# Patient Record
Sex: Female | Born: 1980 | Race: White | Hispanic: No | Marital: Married | Smoking: Current every day smoker
Health system: Southern US, Community
[De-identification: ages and names within clinical notes are randomized; demographics above are authoritative.]

## PROBLEM LIST (undated history)

## (undated) DIAGNOSIS — O99891 Other specified diseases and conditions complicating pregnancy: Secondary | ICD-10-CM

## (undated) DIAGNOSIS — K219 Gastro-esophageal reflux disease without esophagitis: Secondary | ICD-10-CM

## (undated) DIAGNOSIS — M549 Dorsalgia, unspecified: Secondary | ICD-10-CM

## (undated) DIAGNOSIS — R87629 Unspecified abnormal cytological findings in specimens from vagina: Secondary | ICD-10-CM

## (undated) DIAGNOSIS — M797 Fibromyalgia: Secondary | ICD-10-CM

## (undated) DIAGNOSIS — O9989 Other specified diseases and conditions complicating pregnancy, childbirth and the puerperium: Secondary | ICD-10-CM

## (undated) HISTORY — PX: LEEP: SHX91

---

## 2009-12-10 ENCOUNTER — Inpatient Hospital Stay (HOSPITAL_COMMUNITY): Admission: RE | Admit: 2009-12-10 | Discharge: 2009-12-12 | Payer: Self-pay | Admitting: Obstetrics

## 2009-12-11 ENCOUNTER — Encounter: Payer: Self-pay | Admitting: Obstetrics

## 2009-12-18 ENCOUNTER — Ambulatory Visit (HOSPITAL_COMMUNITY): Admission: RE | Admit: 2009-12-18 | Discharge: 2009-12-18 | Payer: Self-pay | Admitting: Obstetrics

## 2010-01-14 ENCOUNTER — Ambulatory Visit (HOSPITAL_COMMUNITY): Admission: RE | Admit: 2010-01-14 | Discharge: 2010-01-14 | Payer: Self-pay | Admitting: Obstetrics

## 2010-02-14 ENCOUNTER — Telehealth: Payer: Self-pay | Admitting: Internal Medicine

## 2010-02-19 ENCOUNTER — Ambulatory Visit: Payer: Self-pay | Admitting: Internal Medicine

## 2010-02-19 ENCOUNTER — Ambulatory Visit: Payer: Self-pay

## 2010-02-19 ENCOUNTER — Ambulatory Visit (HOSPITAL_COMMUNITY): Admission: RE | Admit: 2010-02-19 | Discharge: 2010-02-19 | Payer: Self-pay | Admitting: Internal Medicine

## 2010-02-19 ENCOUNTER — Encounter: Payer: Self-pay | Admitting: Internal Medicine

## 2010-03-27 ENCOUNTER — Ambulatory Visit (HOSPITAL_COMMUNITY): Admission: RE | Admit: 2010-03-27 | Discharge: 2010-03-27 | Payer: Self-pay | Admitting: Obstetrics

## 2010-04-05 ENCOUNTER — Inpatient Hospital Stay (HOSPITAL_COMMUNITY): Admission: AD | Admit: 2010-04-05 | Discharge: 2010-04-06 | Payer: Self-pay | Admitting: Obstetrics & Gynecology

## 2010-05-01 ENCOUNTER — Inpatient Hospital Stay (HOSPITAL_COMMUNITY): Admission: AD | Admit: 2010-05-01 | Discharge: 2010-05-03 | Payer: Self-pay | Admitting: Obstetrics

## 2010-05-05 ENCOUNTER — Encounter: Payer: Self-pay | Admitting: Obstetrics & Gynecology

## 2010-05-05 ENCOUNTER — Inpatient Hospital Stay (HOSPITAL_COMMUNITY): Admission: RE | Admit: 2010-05-05 | Discharge: 2010-05-08 | Payer: Self-pay | Admitting: Obstetrics & Gynecology

## 2010-11-06 ENCOUNTER — Inpatient Hospital Stay (HOSPITAL_COMMUNITY): Admission: AD | Admit: 2010-11-06 | Discharge: 2010-03-17 | Payer: Self-pay | Admitting: Obstetrics

## 2010-12-21 ENCOUNTER — Encounter: Payer: Self-pay | Admitting: Obstetrics

## 2010-12-30 NOTE — Progress Notes (Signed)
Summary: echo  Phone Note Call from Patient Call back at Home Phone [phone]   Caller: Patient Reason for Call: Talk to Nurse Summary of Call: pt called to get echo resch.... 1st available appt is 4/8, wants to know if she can be referred to another cardiologist ofc that could get it done sooner Initial call taken by: Migdalia Dk,  February 14, 2010 11:03 AM  Follow-up for Phone Call        02/14/10--12 noon--pt calling stating she would like to get a sooner appoint for ECHO--she is pregnant and has hx of hemmorrhage in in one of her eyes and her present eye doctor wants her to have it sooner than 4/8--spoke to sched and found a spot for ECHO to be done 02/19/10 at 4pm--pt notified and agrees--nt Follow-up by: Ledon Snare, RN,  February 14, 2010 12:03 PM

## 2011-02-15 LAB — URINALYSIS, ROUTINE W REFLEX MICROSCOPIC
Bilirubin Urine: NEGATIVE
Glucose, UA: NEGATIVE mg/dL
Ketones, ur: NEGATIVE mg/dL
Nitrite: NEGATIVE
Specific Gravity, Urine: 1.015 (ref 1.005–1.030)
pH: 8 (ref 5.0–8.0)

## 2011-02-15 LAB — URINE CULTURE: Colony Count: NO GROWTH

## 2011-02-15 LAB — WET PREP, GENITAL: Yeast Wet Prep HPF POC: NONE SEEN

## 2011-02-15 LAB — GC/CHLAMYDIA PROBE AMP, GENITAL
Chlamydia, DNA Probe: NEGATIVE
GC Probe Amp, Genital: NEGATIVE

## 2011-02-16 LAB — RPR
RPR Ser Ql: NONREACTIVE
RPR Ser Ql: NONREACTIVE

## 2011-02-16 LAB — CBC
HCT: 25 % — ABNORMAL LOW (ref 36.0–46.0)
HCT: 34.2 % — ABNORMAL LOW (ref 36.0–46.0)
MCHC: 34.6 g/dL (ref 30.0–36.0)
MCHC: 35.4 g/dL (ref 30.0–36.0)
MCV: 94.4 fL (ref 78.0–100.0)
MCV: 95.1 fL (ref 78.0–100.0)
Platelets: 198 10*3/uL (ref 150–400)
Platelets: 272 10*3/uL (ref 150–400)
RBC: 3.64 MIL/uL — ABNORMAL LOW (ref 3.87–5.11)
RDW: 13.1 % (ref 11.5–15.5)
RDW: 13.1 % (ref 11.5–15.5)
RDW: 13.4 % (ref 11.5–15.5)
WBC: 13.4 10*3/uL — ABNORMAL HIGH (ref 4.0–10.5)

## 2011-02-16 LAB — GLUCOSE, CAPILLARY
Glucose-Capillary: 77 mg/dL (ref 70–99)
Glucose-Capillary: 85 mg/dL (ref 70–99)

## 2011-08-15 IMAGING — US US OB TRANSVAGINAL
1 series · 11 of 11 positions shown · non-contrast
Comparison: none

OBSTETRICAL ULTRASOUND:
 This ultrasound was performed in The [HOSPITAL], and the [facility] report will be stored to [REDACTED] PACS.  This report is also available in [HOSPITAL]?s accessANYware.

[Series 1: us ob transvaginal · 11 of 11 slices shown]
[im 1/11]
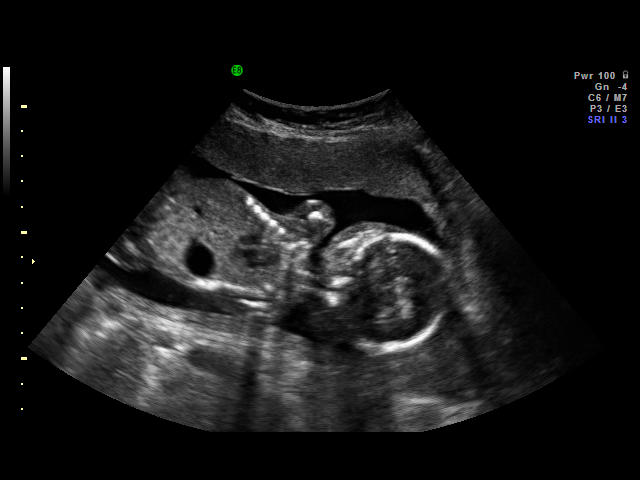
[im 2/11]
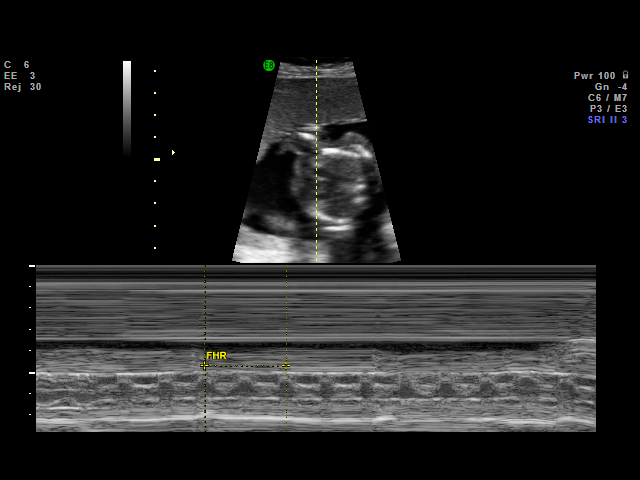
[im 3/11]
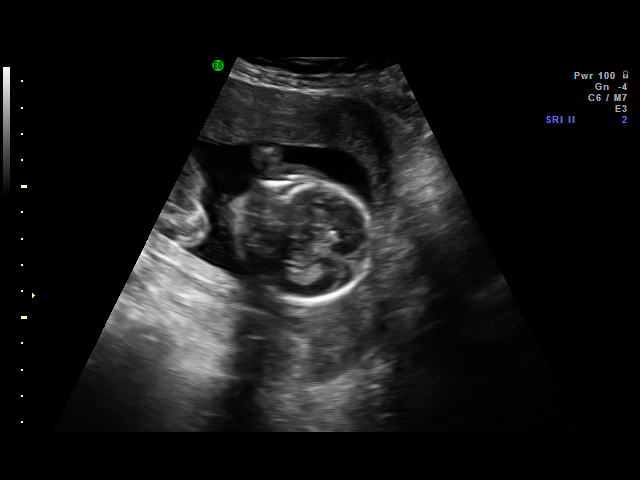
[im 4/11]
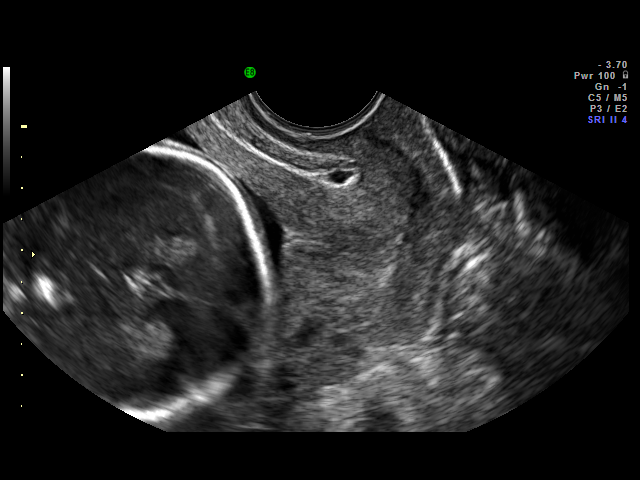
[im 5/11]
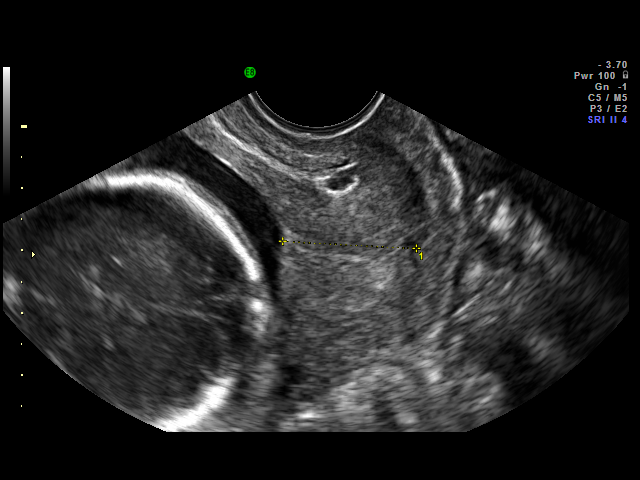
[im 6/11]
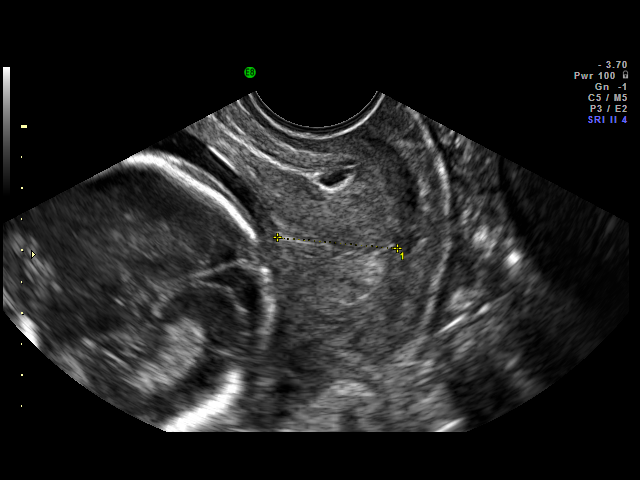
[im 7/11]
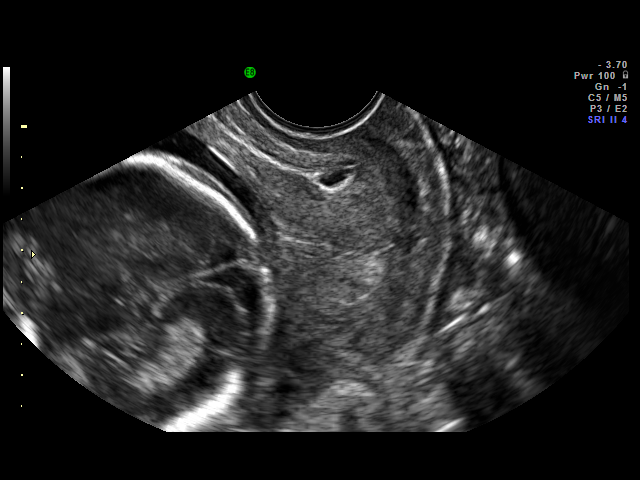
[im 8/11]
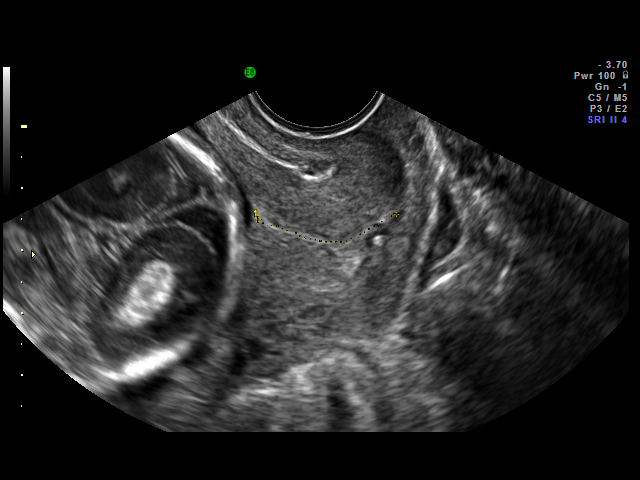
[im 9/11]
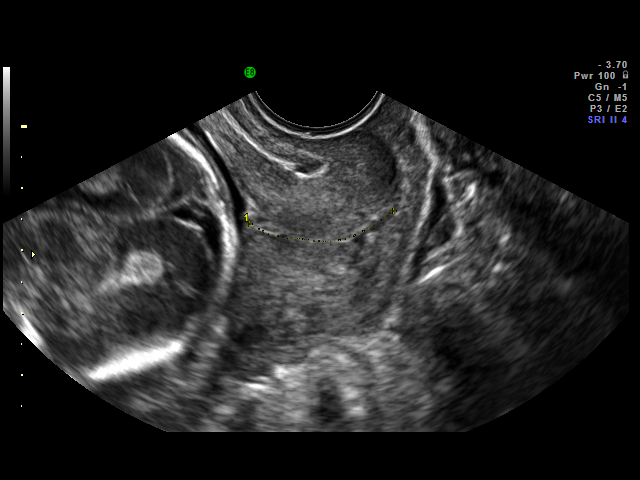
[im 10/11]
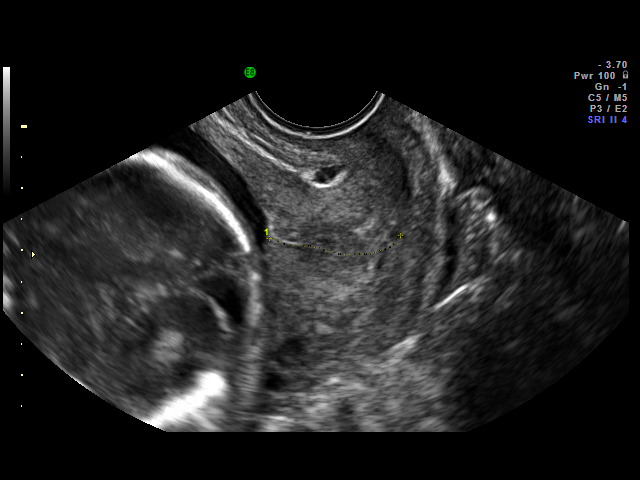
[im 11/11]
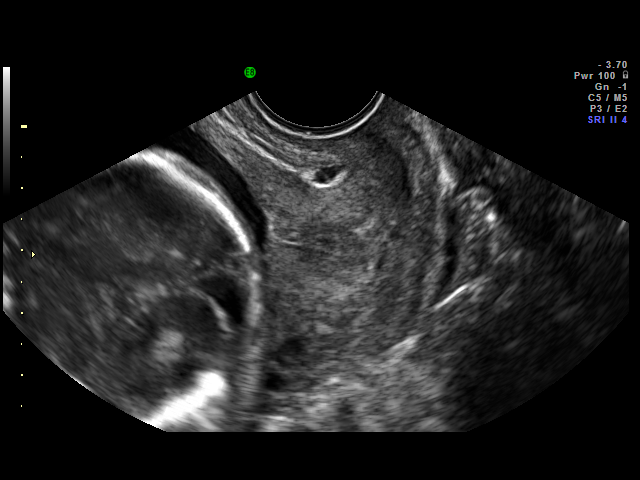

[11 of 11 positions shown; findings below may reference images not displayed]

IMPRESSION: [facility] has also been faxed to the ordering physician.

## 2011-08-22 IMAGING — US US OB TRANSVAGINAL
1 series · 13 of 13 positions shown · non-contrast
Comparison: none

OBSTETRICAL ULTRASOUND:
 This ultrasound was performed in The [HOSPITAL], and the [facility] report will be stored to [REDACTED] PACS.  This report is also available in [HOSPITAL]?s accessANYware.

[Series 1: us ob transvaginal · 13 of 13 slices shown]
[im 1/13]
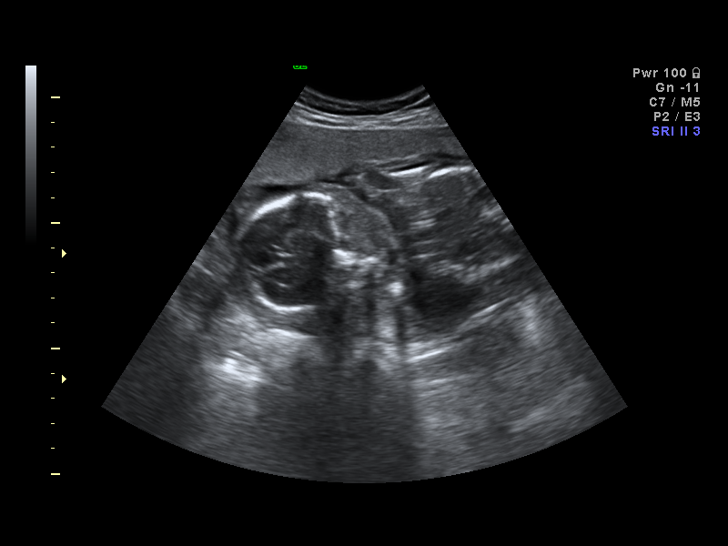
[im 2/13]
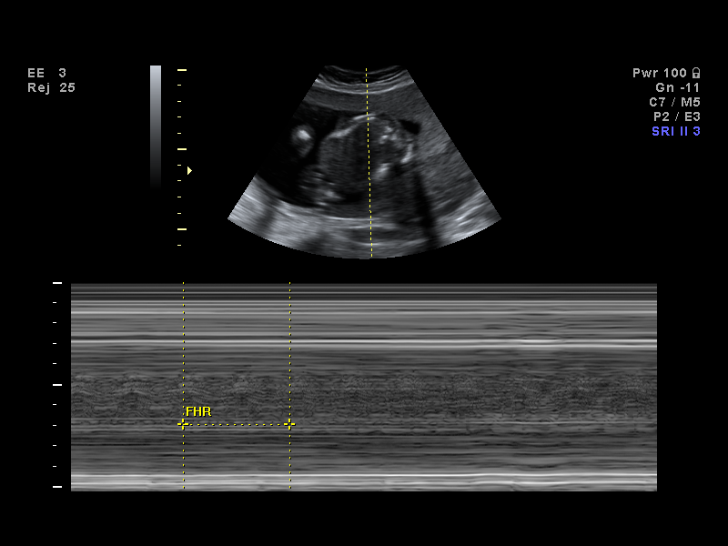
[im 3/13]
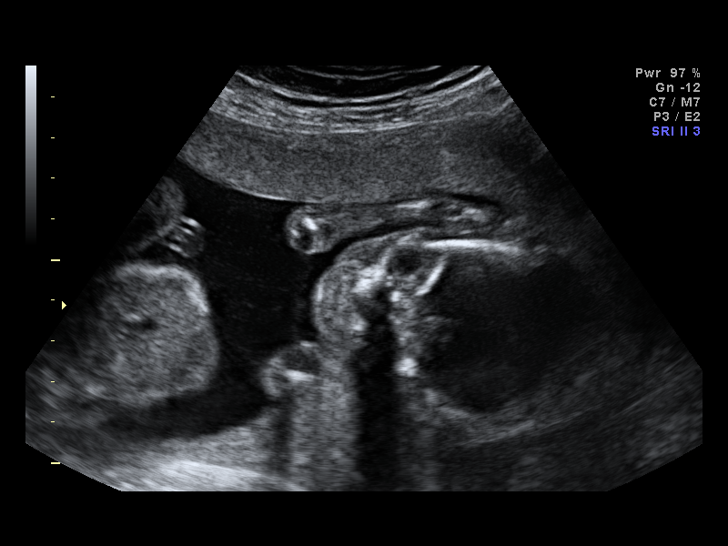
[im 4/13]
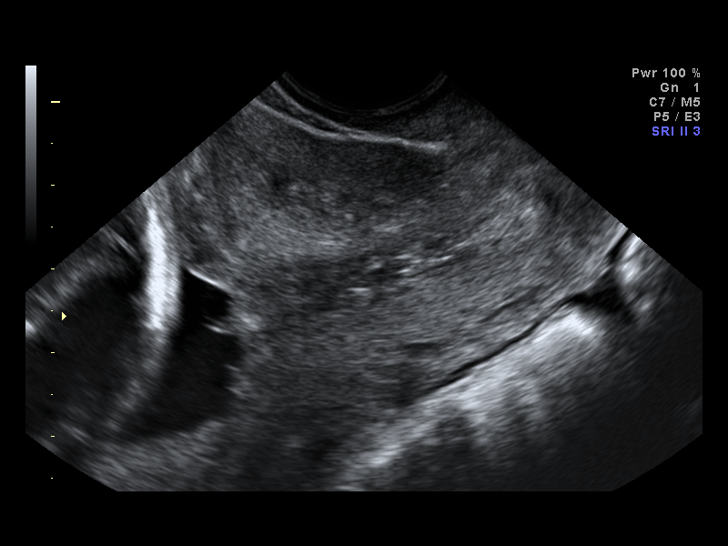
[im 5/13]
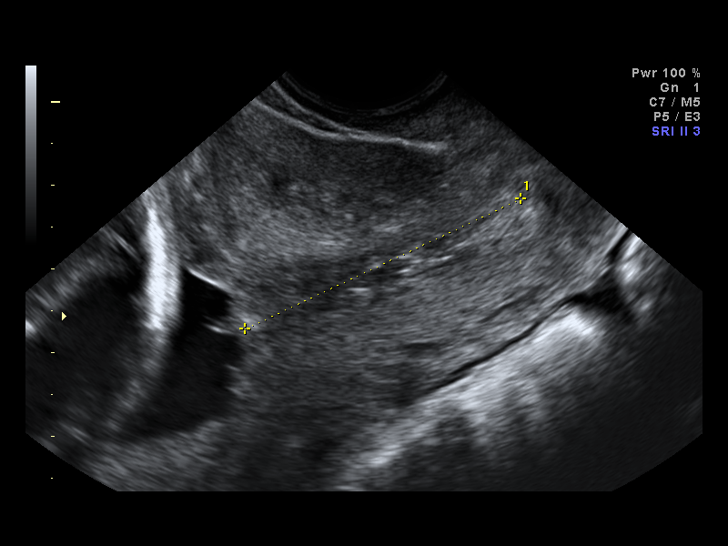
[im 6/13]
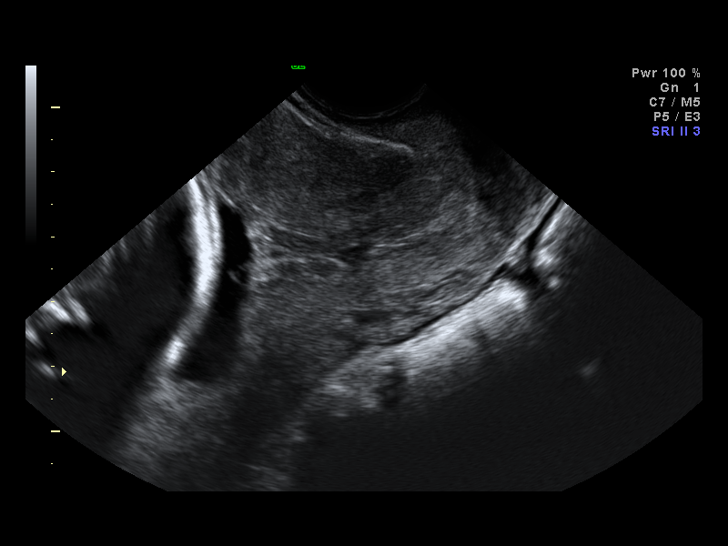
[im 7/13]
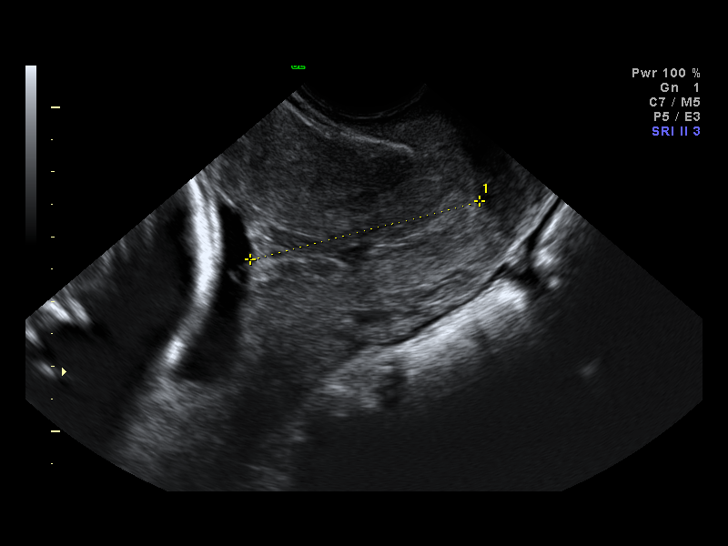
[im 8/13]
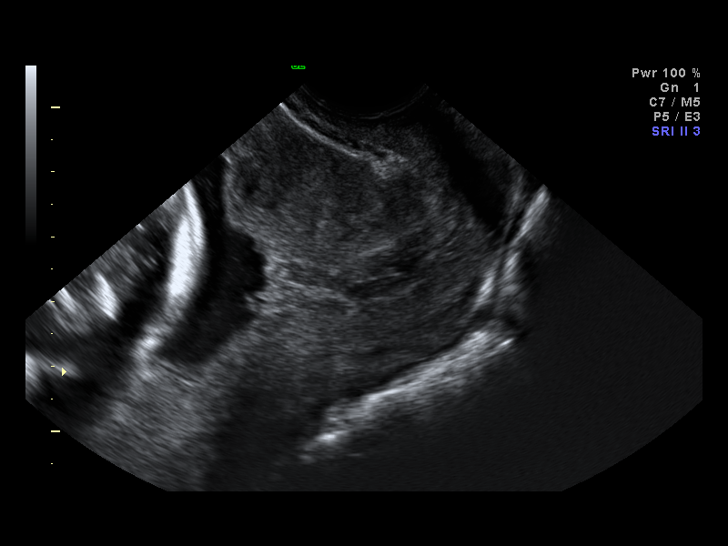
[im 9/13]
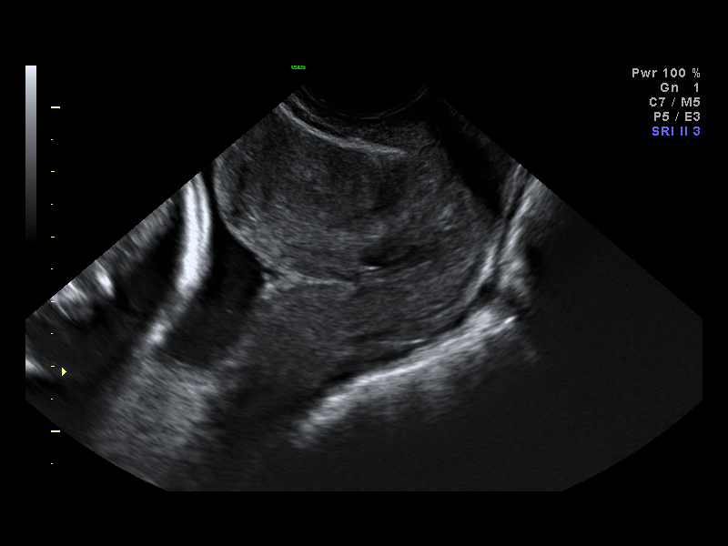
[im 10/13]
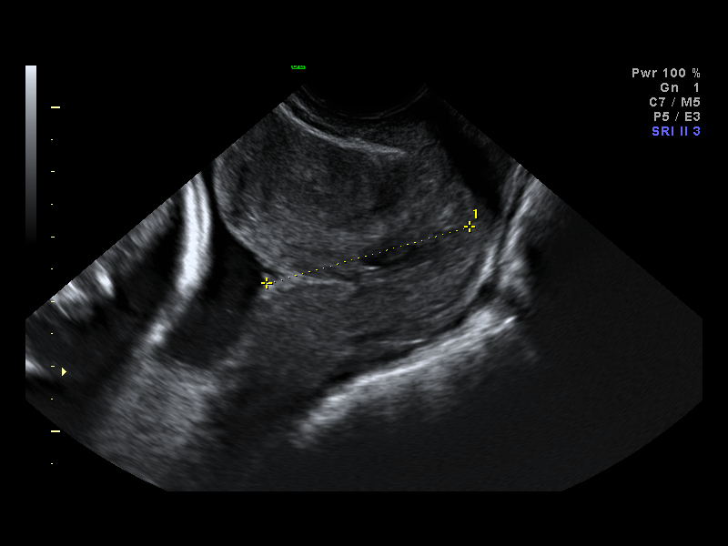
[im 11/13]
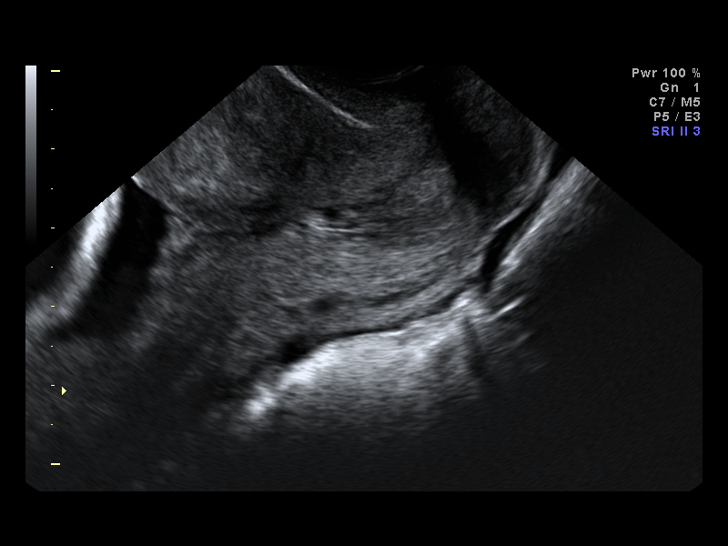
[im 12/13]
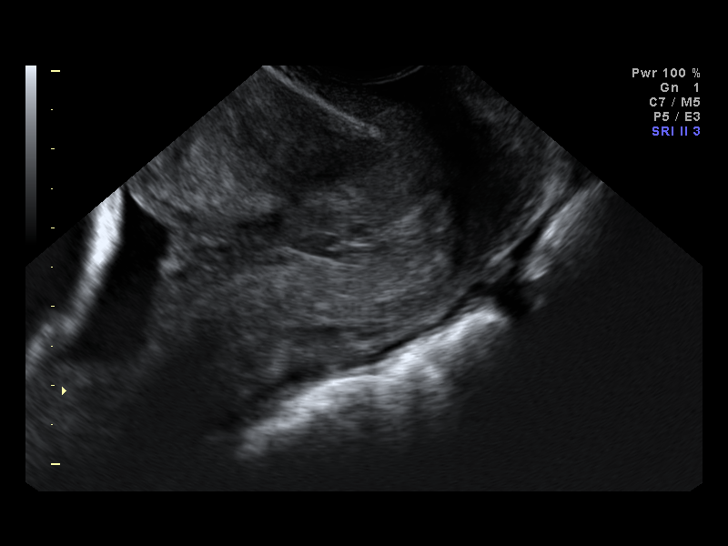
[im 13/13]
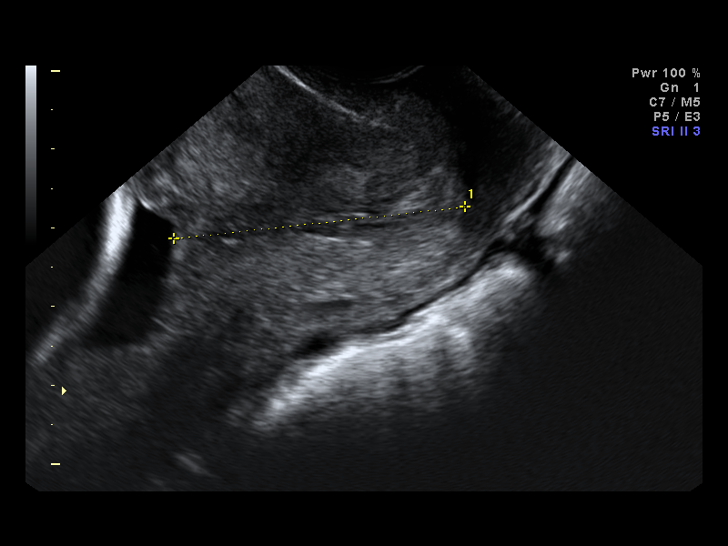

[13 of 13 positions shown; findings below may reference images not displayed]

IMPRESSION: [facility] has also been faxed to the ordering physician.

## 2011-09-01 ENCOUNTER — Inpatient Hospital Stay (INDEPENDENT_AMBULATORY_CARE_PROVIDER_SITE_OTHER)
Admission: RE | Admit: 2011-09-01 | Discharge: 2011-09-01 | Disposition: A | Discharge status: Home / Self Care | Payer: BC Managed Care – PPO | Source: Ambulatory Visit | Attending: Emergency Medicine | Admitting: Emergency Medicine

## 2011-09-01 ENCOUNTER — Encounter: Payer: Self-pay | Admitting: Emergency Medicine

## 2011-09-01 DIAGNOSIS — J039 Acute tonsillitis, unspecified: Secondary | ICD-10-CM

## 2011-09-01 LAB — CONVERTED CEMR LAB: Rapid Strep: NEGATIVE

## 2011-09-18 IMAGING — US US OB TRANSVAGINAL MODIFY
1 series · 14 of 28 positions shown · non-contrast
Comparison: none

OBSTETRICAL ULTRASOUND:
 This ultrasound was performed in The [HOSPITAL], and the [facility] report will be stored to [REDACTED] PACS.  This report is also available in [HOSPITAL]?s accessANYware.

[Series 1: us ob transvaginal modify · 14 of 56 slices shown]
[im 3/56]
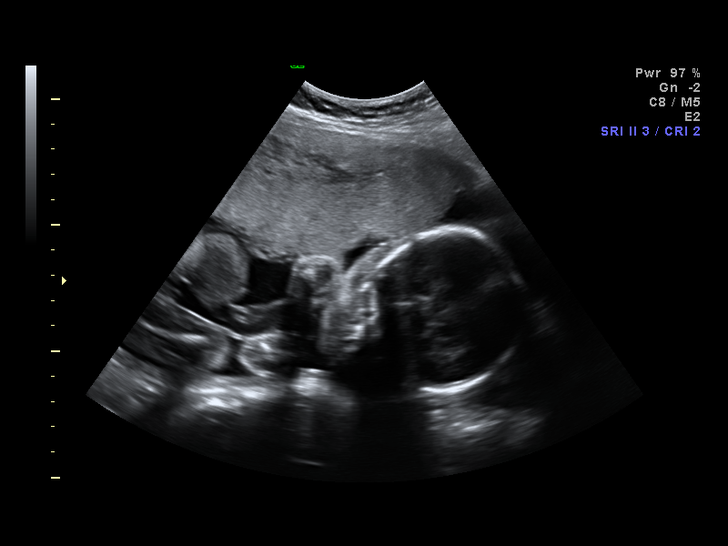
[im 7/56]
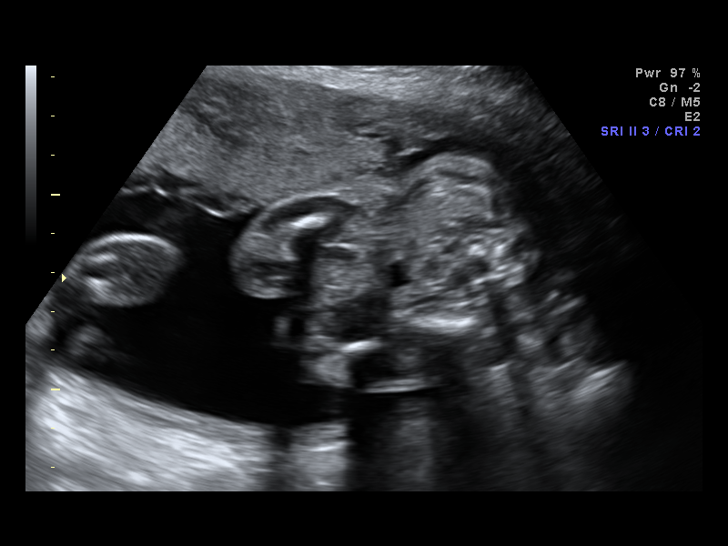
[im 11/56]
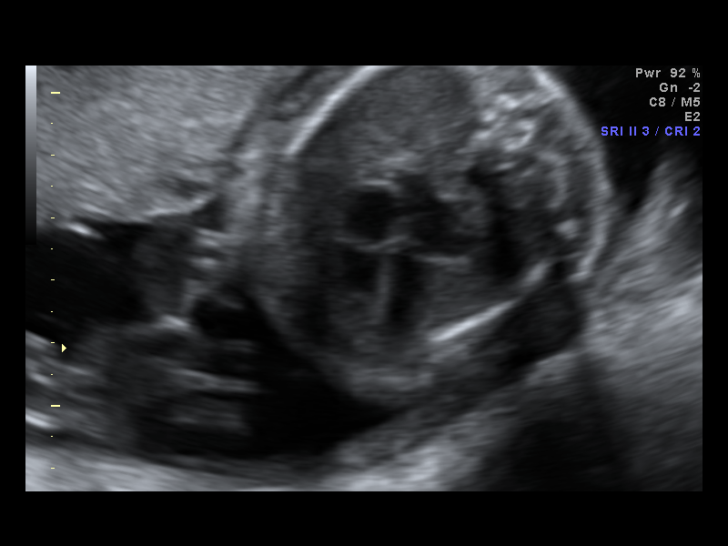
[im 15/56]
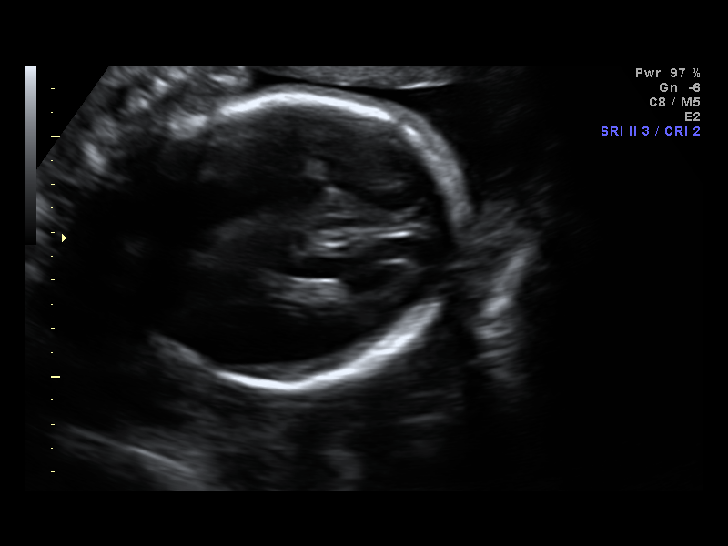
[im 19/56]
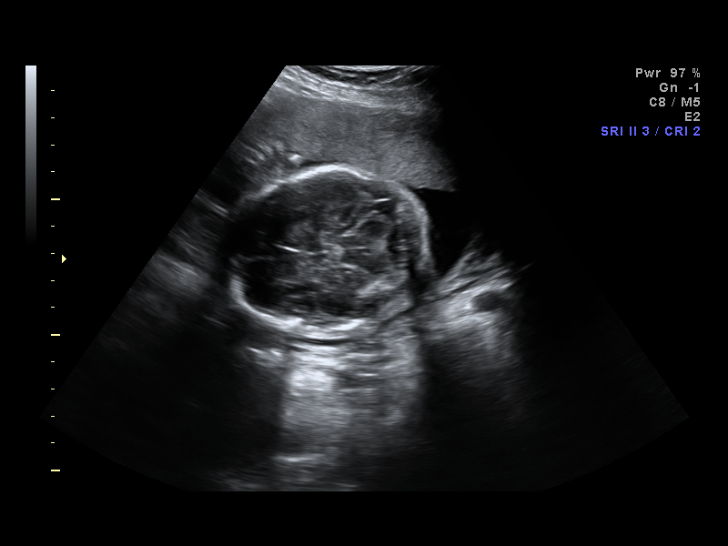
[im 23/56]
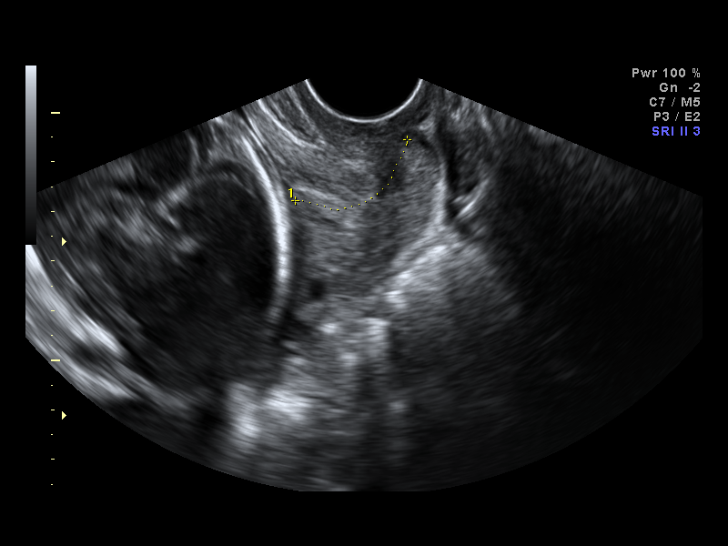
[im 27/56]
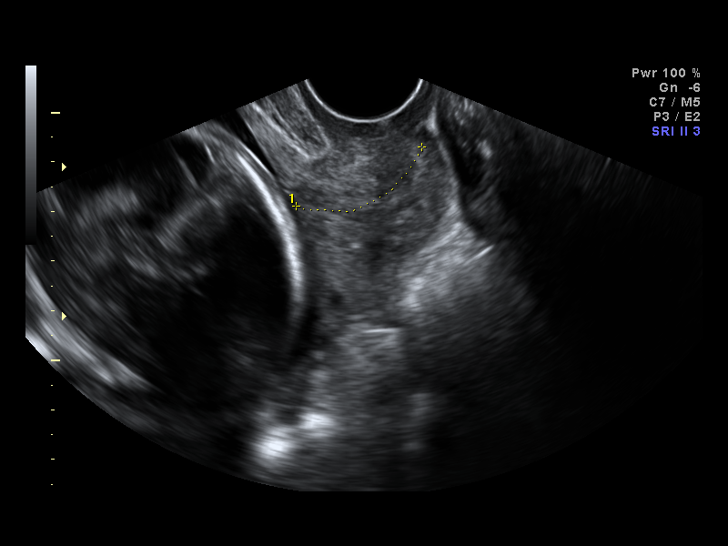
[im 31/56]
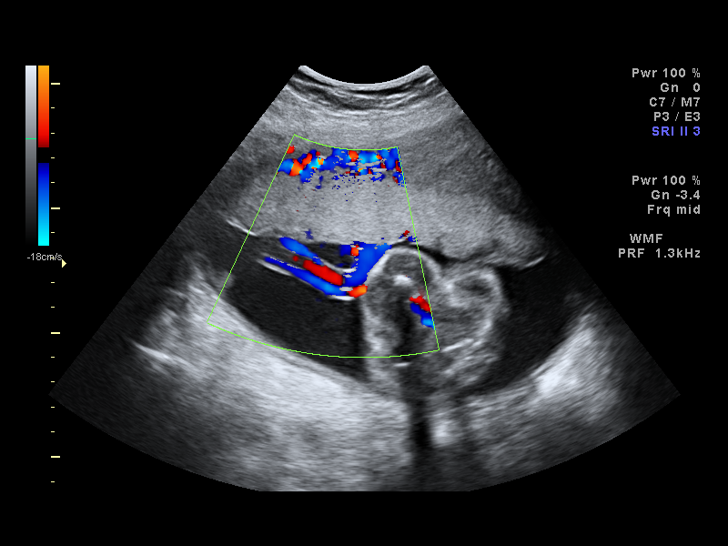
[im 35/56]
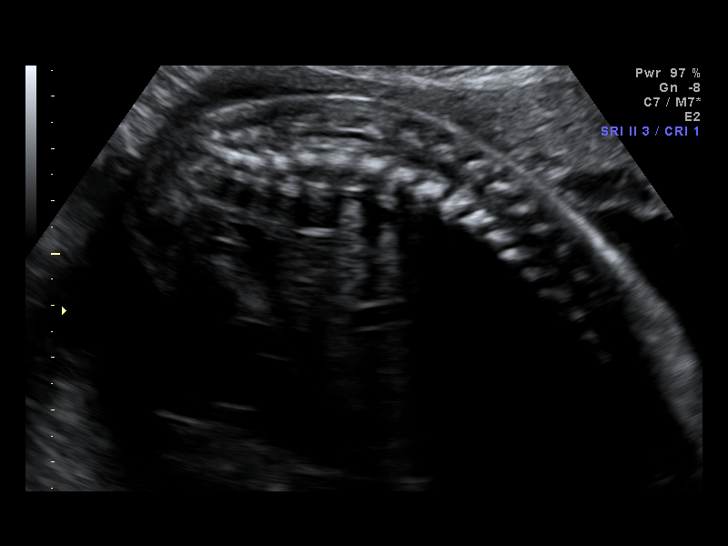
[im 39/56]
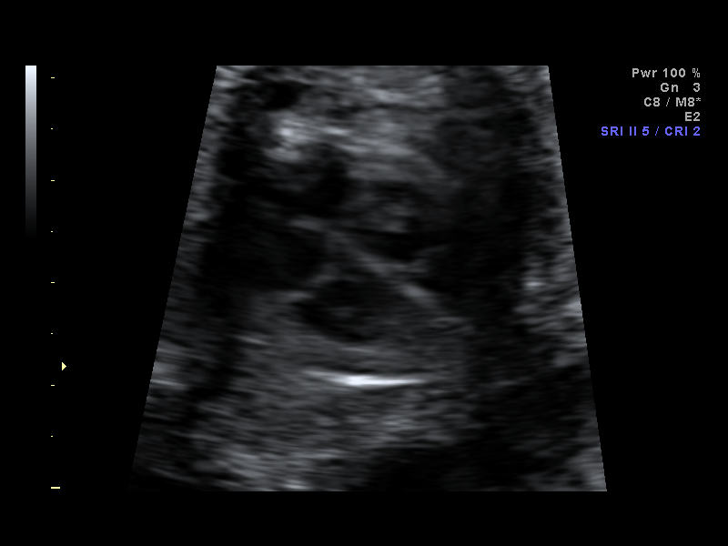
[im 43/56]
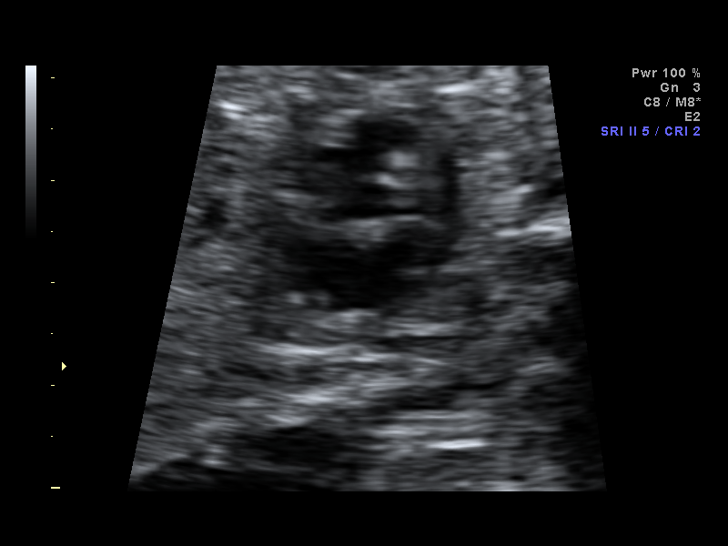
[im 47/56]
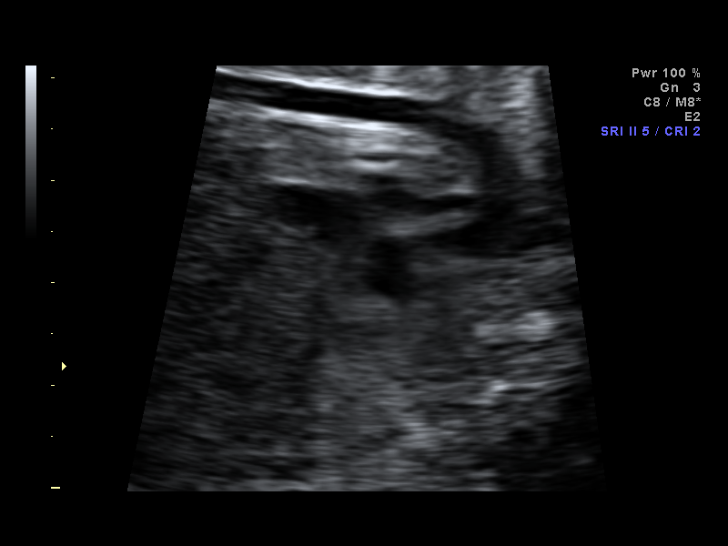
[im 51/56]
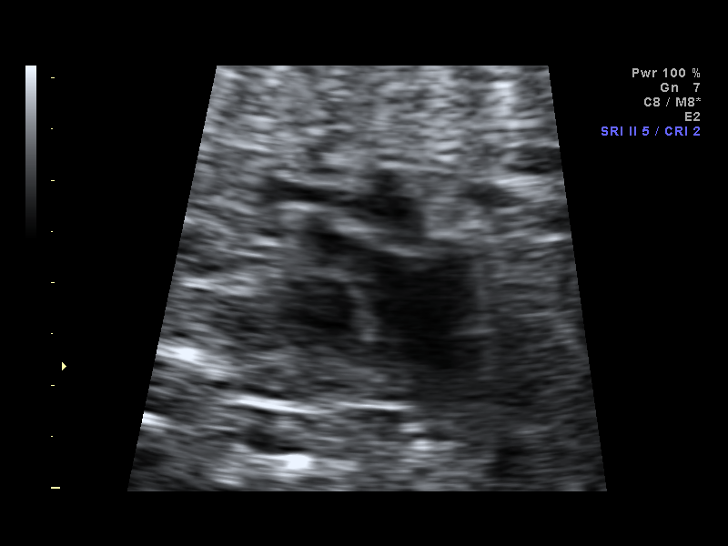
[im 56/56]
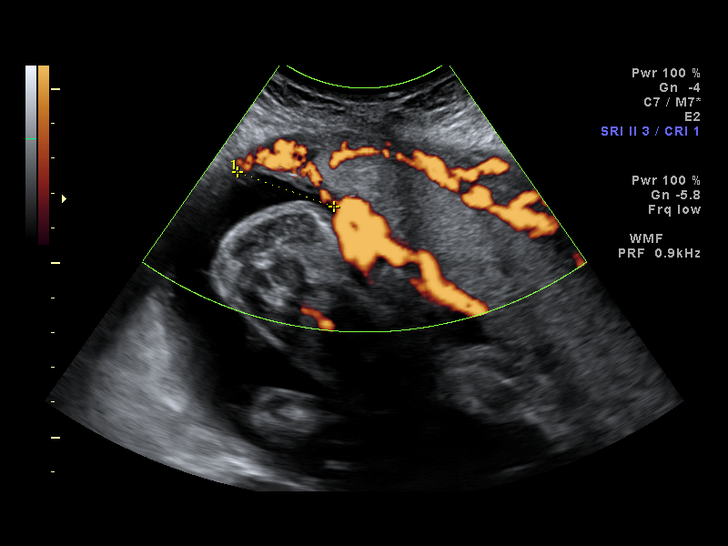

[14 of 28 positions shown; findings below may reference images not displayed]

IMPRESSION: [facility] has also been faxed to the ordering physician.

## 2011-11-02 NOTE — Progress Notes (Signed)
Summary: Possible Strep Throat rm 2   Vital Signs:  Patient Profile:   30 Years Old Female CC:      sore throat x 1 day Height:     58 inches Weight:      111.50 pounds O2 Sat:      100 % O2 treatment:    Room Air Temp:     98.8 degrees F oral Pulse rate:   66 / minute Resp:     16 per minute BP sitting:   105 / 68  (left arm) Cuff size:   regular  Vitals Entered By: Clemens Catholic LPN (September 01, 2011 12:02 PM)                  Updated Prior Medication List: No Medications Current Allergies: No known allergies History of Present Illness History from: patient Chief Complaint: sore throat x 1 day History of Present Illness: Pt complains of 1 day of severe, worsening sore throat. No cough, No dyspnea. No chest pain. No wheezing.  No nausea No vomiting. + fever, + chills and myalgias. OTC meds no help   REVIEW OF SYSTEMS Constitutional Symptoms       Complains of fever and chills.     Denies night sweats, weight loss, weight gain, and fatigue.  Eyes       Complains of eye pain.      Denies change in vision, eye discharge, glasses, contact lenses, and eye surgery. Ear/Nose/Throat/Mouth       Complains of ear pain and sore throat.      Denies hearing loss/aids, change in hearing, ear discharge, dizziness, frequent runny nose, frequent nose bleeds, sinus problems, hoarseness, and tooth pain or bleeding.  Respiratory       Denies dry cough, productive cough, wheezing, shortness of breath, asthma, bronchitis, and emphysema/COPD.  Cardiovascular       Denies murmurs, chest pain, and tires easily with exhertion.      Comments: chest hurts   Gastrointestinal       Denies stomach pain, nausea/vomiting, diarrhea, constipation, blood in bowel movements, and indigestion. Genitourniary       Denies painful urination, kidney stones, and loss of urinary control. Neurological       Complains of numbness, tingling, and weakness.      Denies headaches, loss of or changes in  sensation, tremors, paralysis, seizures, and fainting/blackouts. Musculoskeletal       Complains of muscle pain, joint pain, and joint stiffness.      Denies decreased range of motion, redness, swelling, muscle weakness, and gout.  Skin       Denies bruising, unusual mles/lumps or sores, and hair/skin or nail changes.  Psych       Denies mood changes, temper/anger issues, anxiety/stress, speech problems, depression, and sleep problems. Other Comments: pt c/o sore throat, RT ear ache, fever, achy, and chills x 1 day. she has taken Aleve.   Past History:  Past Medical History: Unremarkable  Past Surgical History: Caesarean section 2011  Family History: mother- lupus, fibromyalgia father- diabetes  Social History: Current Smoker- 5 cigs a day Alcohol use-no Drug use-no Smoking Status:  current Drug Use:  no Physical Exam General appearance: well developed, well nourished, no acute distress. Fatigued. Head: normocephalic, atraumatic, anterior cervical lymphadenopathy Eyes: conjunctivae and lids normal Pupils: equal, round, reactive to light Ears: normal, no lesions or deformities Nasal: mucosa pink, nonedematous, no septal deviation, turbinates normal Oral/Pharynx: bilateral tonsilar enlargement and redness, uvula midline without  deviation. No exudate Neck: neck supple,  trachea midline, no masses Chest/Lungs: no rales, wheezes, or rhonchi bilateral, breath sounds equal without effort Heart: regular rate and  rhythm, no murmur Abdomen: soft, non-tender without obvious organomegaly Extremities: normal extremities Neurological: grossly intact and non-focal Skin: no obvious rashes or lesions MSE: oriented to time, place, and person Assessment New Problems: ACUTE TONSILLITIS (ICD-463)  RST neg  Patient Education: Patient and/or caregiver instructed in the following: rest fluids and Tylenol.  Plan New Medications/Changes: AMOXICILLIN 500 MG CAPS (AMOXICILLIN) 1 three  times a day for 10 days  #30 x 0, 09/01/2011, Lajean Manes MD  New Orders: New Patient Level III [phone] Rapid Strep [60454] Planning Comments:   other sxs care discussed. pt declined strep cx. tx options discussed. Risks, benefits, alternatives discussed. Pt voiced understanding and agreement.  Follow Up: Follow up in 2-3 days if no improvement, Follow up with Primary Physician Follow Up: sooner if worse or new sxs.  The patient and/or caregiver has been counseled thoroughly with regard to medications prescribed including dosage, schedule, interactions, rationale for use, and possible side effects and they verbalize understanding.  Diagnoses and expected course of recovery discussed and will return if not improved as expected or if the condition worsens. Patient and/or caregiver verbalized understanding.  Prescriptions: AMOXICILLIN 500 MG CAPS (AMOXICILLIN) 1 three times a day for 10 days  #30 x 0   Entered and Authorized by:   Lajean Manes MD   Signed by:   Lajean Manes MD on 09/01/2011   Method used:   Handwritten   RxID:   [id number]   Orders Added: 1)  New Patient Level III [30865] 2)  Rapid Strep [[id number]]    Laboratory Results  Date/Time Received: September 01, 2011 12:15 PM  Date/Time Reported: September 01, 2011 12:15 PM   Other Tests  Rapid Strep: negative  Kit Test Internal QC: Negative   (Normal Range: Negative)

## 2013-12-28 LAB — OB RESULTS CONSOLE HIV ANTIBODY (ROUTINE TESTING): HIV: NONREACTIVE

## 2013-12-28 LAB — OB RESULTS CONSOLE ABO/RH: RH Type: POSITIVE

## 2013-12-28 LAB — OB RESULTS CONSOLE RPR: RPR: NONREACTIVE

## 2013-12-28 LAB — OB RESULTS CONSOLE ANTIBODY SCREEN: Antibody Screen: NEGATIVE

## 2014-03-27 ENCOUNTER — Ambulatory Visit (HOSPITAL_COMMUNITY): Payer: BC Managed Care – PPO | Attending: Obstetrics

## 2014-04-03 ENCOUNTER — Encounter (HOSPITAL_COMMUNITY): Payer: BC Managed Care – PPO

## 2014-06-27 ENCOUNTER — Other Ambulatory Visit: Payer: Self-pay | Admitting: Obstetrics

## 2014-06-28 LAB — OB RESULTS CONSOLE GBS: GBS: NEGATIVE

## 2014-07-20 ENCOUNTER — Encounter (HOSPITAL_COMMUNITY)
Admission: AD | Disposition: A | Discharge status: Home / Self Care | Payer: Self-pay | Source: Ambulatory Visit | Attending: Obstetrics and Gynecology

## 2014-07-20 ENCOUNTER — Inpatient Hospital Stay (HOSPITAL_COMMUNITY): Payer: BC Managed Care – PPO | Admitting: Anesthesiology

## 2014-07-20 ENCOUNTER — Encounter (HOSPITAL_COMMUNITY): Payer: Self-pay | Admitting: *Deleted

## 2014-07-20 ENCOUNTER — Inpatient Hospital Stay (HOSPITAL_COMMUNITY)
Admission: AD | Admit: 2014-07-20 | Discharge: 2014-07-24 | DRG: 765 | Disposition: A | Discharge status: Home / Self Care | Payer: BC Managed Care – PPO | Source: Ambulatory Visit | Attending: Obstetrics and Gynecology | Admitting: Obstetrics and Gynecology

## 2014-07-20 ENCOUNTER — Encounter (HOSPITAL_COMMUNITY): Payer: BC Managed Care – PPO | Admitting: Anesthesiology

## 2014-07-20 ENCOUNTER — Encounter (HOSPITAL_COMMUNITY): Payer: Self-pay

## 2014-07-20 DIAGNOSIS — O99334 Smoking (tobacco) complicating childbirth: Secondary | ICD-10-CM | POA: Diagnosis present

## 2014-07-20 DIAGNOSIS — D62 Acute posthemorrhagic anemia: Secondary | ICD-10-CM | POA: Diagnosis not present

## 2014-07-20 DIAGNOSIS — F1920 Other psychoactive substance dependence, uncomplicated: Secondary | ICD-10-CM | POA: Diagnosis present

## 2014-07-20 DIAGNOSIS — IMO0001 Reserved for inherently not codable concepts without codable children: Secondary | ICD-10-CM | POA: Diagnosis present

## 2014-07-20 DIAGNOSIS — O9903 Anemia complicating the puerperium: Secondary | ICD-10-CM | POA: Diagnosis present

## 2014-07-20 DIAGNOSIS — D509 Iron deficiency anemia, unspecified: Secondary | ICD-10-CM | POA: Diagnosis present

## 2014-07-20 DIAGNOSIS — Z98891 History of uterine scar from previous surgery: Secondary | ICD-10-CM

## 2014-07-20 DIAGNOSIS — O479 False labor, unspecified: Secondary | ICD-10-CM | POA: Diagnosis present

## 2014-07-20 DIAGNOSIS — O99324 Drug use complicating childbirth: Secondary | ICD-10-CM

## 2014-07-20 DIAGNOSIS — K219 Gastro-esophageal reflux disease without esophagitis: Secondary | ICD-10-CM | POA: Diagnosis present

## 2014-07-20 DIAGNOSIS — F1120 Opioid dependence, uncomplicated: Secondary | ICD-10-CM | POA: Diagnosis present

## 2014-07-20 DIAGNOSIS — O34219 Maternal care for unspecified type scar from previous cesarean delivery: Principal | ICD-10-CM | POA: Diagnosis present

## 2014-07-20 HISTORY — DX: Gastro-esophageal reflux disease without esophagitis: K21.9

## 2014-07-20 HISTORY — DX: Other specified diseases and conditions complicating pregnancy, childbirth and the puerperium: O99.89

## 2014-07-20 HISTORY — DX: Unspecified abnormal cytological findings in specimens from vagina: R87.629

## 2014-07-20 HISTORY — DX: Other specified diseases and conditions complicating pregnancy: O99.891

## 2014-07-20 HISTORY — DX: Dorsalgia, unspecified: M54.9

## 2014-07-20 HISTORY — DX: Fibromyalgia: M79.7

## 2014-07-20 LAB — CBC
HCT: 34.2 % — ABNORMAL LOW (ref 36.0–46.0)
Hemoglobin: 11.8 g/dL — ABNORMAL LOW (ref 12.0–15.0)
MCH: 31.7 pg (ref 26.0–34.0)
MCHC: 34.5 g/dL (ref 30.0–36.0)
MCV: 91.9 fL (ref 78.0–100.0)
PLATELETS: 334 10*3/uL (ref 150–400)
RBC: 3.72 MIL/uL — ABNORMAL LOW (ref 3.87–5.11)
RDW: 13.7 % (ref 11.5–15.5)
WBC: 15.8 10*3/uL — ABNORMAL HIGH (ref 4.0–10.5)

## 2014-07-20 LAB — ABO/RH: ABO/RH(D): O POS

## 2014-07-20 LAB — TYPE AND SCREEN
ABO/RH(D): O POS
Antibody Screen: NEGATIVE

## 2014-07-20 SURGERY — Surgical Case
Anesthesia: Spinal

## 2014-07-20 MED ORDER — DIPHENHYDRAMINE HCL 12.5 MG/5ML PO ELIX
12.5000 mg | ORAL_SOLUTION | Freq: Four times a day (QID) | ORAL | Status: DC | PRN
  Filled 2014-07-20: qty 5

## 2014-07-20 MED ORDER — LACTATED RINGERS IV BOLUS (SEPSIS)
1000.0000 mL | Freq: Once | INTRAVENOUS | Status: AC
  Administered 2014-07-20: 1000 mL via INTRAVENOUS

## 2014-07-20 MED ORDER — NALOXONE HCL 0.4 MG/ML IJ SOLN: 0.4000 mg | INTRAMUSCULAR | Status: DC | PRN

## 2014-07-20 MED ORDER — OXYTOCIN 40 UNITS IN LACTATED RINGERS INFUSION - SIMPLE MED: 62.5000 mL/h | INTRAVENOUS | Status: AC

## 2014-07-20 MED ORDER — HYDROMORPHONE HCL PF 1 MG/ML IJ SOLN
1.0000 mg | Freq: Once | INTRAMUSCULAR | Status: AC
  Administered 2014-07-20: 1 mg via INTRAVENOUS

## 2014-07-20 MED ORDER — OXYTOCIN 10 UNIT/ML IJ SOLN
40.0000 [IU] | INTRAVENOUS | Status: DC | PRN
  Administered 2014-07-20: 40 [IU] via INTRAVENOUS

## 2014-07-20 MED ORDER — METOCLOPRAMIDE HCL 5 MG/ML IJ SOLN: 10.0000 mg | Freq: Once | INTRAMUSCULAR | Status: DC | PRN

## 2014-07-20 MED ORDER — MORPHINE SULFATE 0.5 MG/ML IJ SOLN
INTRAMUSCULAR | Status: AC
  Filled 2014-07-20: qty 10

## 2014-07-20 MED ORDER — ACETAMINOPHEN 10 MG/ML IV SOLN
1000.0000 mg | Freq: Once | INTRAVENOUS | Status: AC
  Administered 2014-07-20: 1000 mg via INTRAVENOUS
  Filled 2014-07-20: qty 100

## 2014-07-20 MED ORDER — METHYLERGONOVINE MALEATE 0.2 MG PO TABS: 0.2000 mg | ORAL_TABLET | ORAL | Status: DC | PRN

## 2014-07-20 MED ORDER — NALOXONE HCL 1 MG/ML IJ SOLN: 1.0000 ug/kg/h | INTRAVENOUS | Status: DC | PRN

## 2014-07-20 MED ORDER — ZOLPIDEM TARTRATE 5 MG PO TABS: 5.0000 mg | ORAL_TABLET | Freq: Every evening | ORAL | Status: DC | PRN

## 2014-07-20 MED ORDER — ONDANSETRON HCL 4 MG/2ML IJ SOLN: 4.0000 mg | Freq: Three times a day (TID) | INTRAMUSCULAR | Status: DC | PRN

## 2014-07-20 MED ORDER — FENTANYL CITRATE 0.05 MG/ML IJ SOLN
INTRAMUSCULAR | Status: AC
  Filled 2014-07-20: qty 2

## 2014-07-20 MED ORDER — HYDROMORPHONE HCL PF 1 MG/ML IJ SOLN
INTRAMUSCULAR | Status: AC
  Filled 2014-07-20: qty 1

## 2014-07-20 MED ORDER — DIPHENHYDRAMINE HCL 50 MG/ML IJ SOLN: 12.5000 mg | Freq: Four times a day (QID) | INTRAMUSCULAR | Status: DC | PRN

## 2014-07-20 MED ORDER — DIPHENHYDRAMINE HCL 50 MG/ML IJ SOLN: 25.0000 mg | INTRAMUSCULAR | Status: DC | PRN

## 2014-07-20 MED ORDER — METOCLOPRAMIDE HCL 5 MG/ML IJ SOLN
10.0000 mg | Freq: Three times a day (TID) | INTRAMUSCULAR | Status: DC | PRN
Start: 2014-07-20 — End: 2014-07-22

## 2014-07-20 MED ORDER — ONDANSETRON HCL 4 MG/2ML IJ SOLN: 4.0000 mg | Freq: Four times a day (QID) | INTRAMUSCULAR | Status: DC | PRN

## 2014-07-20 MED ORDER — LANOLIN HYDROUS EX OINT: 1.0000 "application " | TOPICAL_OINTMENT | CUTANEOUS | Status: DC | PRN

## 2014-07-20 MED ORDER — MEPERIDINE HCL 25 MG/ML IJ SOLN: 6.2500 mg | INTRAMUSCULAR | Status: DC | PRN

## 2014-07-20 MED ORDER — SIMETHICONE 80 MG PO CHEW
80.0000 mg | CHEWABLE_TABLET | ORAL | Status: DC | PRN
  Administered 2014-07-22: 80 mg via ORAL

## 2014-07-20 MED ORDER — ONDANSETRON HCL 4 MG/2ML IJ SOLN
INTRAMUSCULAR | Status: DC | PRN
  Administered 2014-07-20: 4 mg via INTRAVENOUS

## 2014-07-20 MED ORDER — NALBUPHINE HCL 10 MG/ML IJ SOLN: 5.0000 mg | INTRAMUSCULAR | Status: DC | PRN

## 2014-07-20 MED ORDER — METHYLERGONOVINE MALEATE 0.2 MG/ML IJ SOLN: 0.2000 mg | INTRAMUSCULAR | Status: DC | PRN

## 2014-07-20 MED ORDER — SODIUM CHLORIDE 0.9 % IJ SOLN: 3.0000 mL | INTRAMUSCULAR | Status: DC | PRN

## 2014-07-20 MED ORDER — IBUPROFEN 600 MG PO TABS
600.0000 mg | ORAL_TABLET | Freq: Four times a day (QID) | ORAL | Status: DC
  Administered 2014-07-21 – 2014-07-23 (×8): 600 mg via ORAL
  Filled 2014-07-20 (×8): qty 1

## 2014-07-20 MED ORDER — BUPIVACAINE HCL (PF) 0.25 % IJ SOLN
INTRAMUSCULAR | Status: AC
  Filled 2014-07-20: qty 30

## 2014-07-20 MED ORDER — CELECOXIB 200 MG PO CAPS
400.0000 mg | ORAL_CAPSULE | Freq: Once | ORAL | Status: AC
  Administered 2014-07-20: 400 mg via ORAL
  Filled 2014-07-20: qty 2

## 2014-07-20 MED ORDER — DIBUCAINE 1 % RE OINT: 1.0000 "application " | TOPICAL_OINTMENT | RECTAL | Status: DC | PRN

## 2014-07-20 MED ORDER — KETOROLAC TROMETHAMINE 30 MG/ML IJ SOLN: 30.0000 mg | Freq: Four times a day (QID) | INTRAMUSCULAR | Status: AC | PRN

## 2014-07-20 MED ORDER — DIPHENHYDRAMINE HCL 50 MG/ML IJ SOLN: 12.5000 mg | INTRAMUSCULAR | Status: DC | PRN

## 2014-07-20 MED ORDER — MEPERIDINE HCL 25 MG/ML IJ SOLN
INTRAMUSCULAR | Status: AC
  Filled 2014-07-20: qty 1

## 2014-07-20 MED ORDER — PRENATAL MULTIVITAMIN CH
1.0000 | ORAL_TABLET | Freq: Every day | ORAL | Status: DC
  Administered 2014-07-22 – 2014-07-24 (×3): 1 via ORAL
  Filled 2014-07-20 (×4): qty 1

## 2014-07-20 MED ORDER — CITRIC ACID-SODIUM CITRATE 334-500 MG/5ML PO SOLN
30.0000 mL | Freq: Once | ORAL | Status: AC
  Administered 2014-07-20: 30 mL via ORAL
  Filled 2014-07-20: qty 15

## 2014-07-20 MED ORDER — HYDROMORPHONE HCL PF 1 MG/ML IJ SOLN
1.0000 mg | INTRAMUSCULAR | Status: DC
  Administered 2014-07-20: 1 mg via INTRAVENOUS

## 2014-07-20 MED ORDER — SCOPOLAMINE 1 MG/3DAYS TD PT72
MEDICATED_PATCH | TRANSDERMAL | Status: AC
  Filled 2014-07-20: qty 1

## 2014-07-20 MED ORDER — FENTANYL CITRATE 0.05 MG/ML IJ SOLN
INTRAMUSCULAR | Status: DC | PRN
Start: 2014-07-20 — End: 2014-07-20
  Administered 2014-07-20: 100 ug via INTRAVENOUS
  Administered 2014-07-20: 75 ug via INTRAVENOUS
  Administered 2014-07-20: 25 ug via INTRATHECAL

## 2014-07-20 MED ORDER — HYDROMORPHONE HCL PF 1 MG/ML IJ SOLN
INTRAMUSCULAR | Status: AC
  Administered 2014-07-20: 1 mg via INTRAVENOUS
  Filled 2014-07-20: qty 1

## 2014-07-20 MED ORDER — HYDROMORPHONE 0.3 MG/ML IV SOLN
INTRAVENOUS | Status: DC
  Administered 2014-07-20: 22:00:00 via INTRAVENOUS
  Administered 2014-07-21: 3 mL via INTRAVENOUS
  Administered 2014-07-21: 1 mL via INTRAVENOUS
  Filled 2014-07-20: qty 25

## 2014-07-20 MED ORDER — CEFAZOLIN SODIUM-DEXTROSE 2-3 GM-% IV SOLR
2.0000 g | INTRAVENOUS | Status: AC
  Administered 2014-07-20: 2 g via INTRAVENOUS
  Filled 2014-07-20: qty 50

## 2014-07-20 MED ORDER — DIPHENHYDRAMINE HCL 25 MG PO CAPS: 25.0000 mg | ORAL_CAPSULE | Freq: Four times a day (QID) | ORAL | Status: DC | PRN

## 2014-07-20 MED ORDER — PHENYLEPHRINE 8 MG IN D5W 100 ML (0.08MG/ML) PREMIX OPTIME
INJECTION | INTRAVENOUS | Status: DC | PRN
  Administered 2014-07-20: 60 ug/min via INTRAVENOUS

## 2014-07-20 MED ORDER — OXYTOCIN 10 UNIT/ML IJ SOLN
INTRAMUSCULAR | Status: AC
Start: 2014-07-20 — End: 2014-07-20
  Filled 2014-07-20: qty 4

## 2014-07-20 MED ORDER — TETANUS-DIPHTH-ACELL PERTUSSIS 5-2.5-18.5 LF-MCG/0.5 IM SUSP: 0.5000 mL | Freq: Once | INTRAMUSCULAR | Status: DC

## 2014-07-20 MED ORDER — WITCH HAZEL-GLYCERIN EX PADS: 1.0000 "application " | MEDICATED_PAD | CUTANEOUS | Status: DC | PRN

## 2014-07-20 MED ORDER — DIPHENHYDRAMINE HCL 25 MG PO CAPS: 25.0000 mg | ORAL_CAPSULE | ORAL | Status: DC | PRN

## 2014-07-20 MED ORDER — FENTANYL CITRATE 0.05 MG/ML IJ SOLN: 25.0000 ug | INTRAMUSCULAR | Status: DC | PRN

## 2014-07-20 MED ORDER — MORPHINE SULFATE (PF) 0.5 MG/ML IJ SOLN
INTRAMUSCULAR | Status: DC | PRN
  Administered 2014-07-20: .15 mg via INTRATHECAL

## 2014-07-20 MED ORDER — SODIUM CHLORIDE 0.9 % IJ SOLN: 9.0000 mL | INTRAMUSCULAR | Status: DC | PRN

## 2014-07-20 MED ORDER — ONDANSETRON HCL 4 MG PO TABS: 4.0000 mg | ORAL_TABLET | ORAL | Status: DC | PRN

## 2014-07-20 MED ORDER — LACTATED RINGERS IV SOLN
INTRAVENOUS | Status: DC
  Administered 2014-07-21: 09:00:00 via INTRAVENOUS

## 2014-07-20 MED ORDER — SIMETHICONE 80 MG PO CHEW
80.0000 mg | CHEWABLE_TABLET | ORAL | Status: DC
  Administered 2014-07-20 – 2014-07-24 (×4): 80 mg via ORAL
  Filled 2014-07-20 (×6): qty 1

## 2014-07-20 MED ORDER — FAMOTIDINE IN NACL 20-0.9 MG/50ML-% IV SOLN
20.0000 mg | Freq: Once | INTRAVENOUS | Status: AC
  Administered 2014-07-20: 20 mg via INTRAVENOUS
  Filled 2014-07-20: qty 50

## 2014-07-20 MED ORDER — GABAPENTIN 400 MG PO CAPS
400.0000 mg | ORAL_CAPSULE | Freq: Once | ORAL | Status: AC
  Administered 2014-07-20: 400 mg via ORAL
  Filled 2014-07-20: qty 1

## 2014-07-20 MED ORDER — SENNOSIDES-DOCUSATE SODIUM 8.6-50 MG PO TABS
2.0000 | ORAL_TABLET | ORAL | Status: DC
  Administered 2014-07-20 – 2014-07-24 (×3): 2 via ORAL
  Filled 2014-07-20 (×4): qty 2

## 2014-07-20 MED ORDER — BUPIVACAINE HCL (PF) 0.25 % IJ SOLN
INTRAMUSCULAR | Status: DC | PRN
  Administered 2014-07-20: 2 mL
  Administered 2014-07-20: 28 mL

## 2014-07-20 MED ORDER — BUPIVACAINE IN DEXTROSE 0.75-8.25 % IT SOLN
INTRATHECAL | Status: DC | PRN
  Administered 2014-07-20: 1.4 mL via INTRATHECAL

## 2014-07-20 MED ORDER — HYDROMORPHONE HCL PF 1 MG/ML IJ SOLN
INTRAMUSCULAR | Status: AC
  Administered 2014-07-20: 1 mg via INTRAVENOUS
  Filled 2014-07-20: qty 3

## 2014-07-20 MED ORDER — MENTHOL 3 MG MT LOZG: 1.0000 | LOZENGE | OROMUCOSAL | Status: DC | PRN

## 2014-07-20 MED ORDER — HYDROMORPHONE HCL PF 1 MG/ML IJ SOLN
1.0000 mg | INTRAMUSCULAR | Status: DC | PRN
  Administered 2014-07-20 (×3): 1 mg via INTRAVENOUS

## 2014-07-20 MED ORDER — ONDANSETRON HCL 4 MG/2ML IJ SOLN: 4.0000 mg | INTRAMUSCULAR | Status: DC | PRN

## 2014-07-20 MED ORDER — LACTATED RINGERS IV SOLN
INTRAVENOUS | Status: DC
  Administered 2014-07-20 (×3): via INTRAVENOUS

## 2014-07-20 MED ORDER — METHADONE HCL 10 MG/ML PO CONC
50.0000 mg | ORAL | Status: DC
  Administered 2014-07-21 – 2014-07-24 (×4): 50 mg via ORAL
  Filled 2014-07-20 (×5): qty 5

## 2014-07-20 MED ORDER — SCOPOLAMINE 1 MG/3DAYS TD PT72
1.0000 | MEDICATED_PATCH | Freq: Once | TRANSDERMAL | Status: AC
  Administered 2014-07-20: 1.5 mg via TRANSDERMAL

## 2014-07-20 MED ORDER — MEPERIDINE HCL 25 MG/ML IJ SOLN
6.2500 mg | INTRAMUSCULAR | Status: DC | PRN
  Administered 2014-07-20: 25 mg via INTRAVENOUS

## 2014-07-20 MED ORDER — ONDANSETRON HCL 4 MG/2ML IJ SOLN
INTRAMUSCULAR | Status: AC
  Filled 2014-07-20: qty 2

## 2014-07-20 MED ORDER — SIMETHICONE 80 MG PO CHEW
80.0000 mg | CHEWABLE_TABLET | Freq: Three times a day (TID) | ORAL | Status: DC
  Administered 2014-07-21 – 2014-07-22 (×5): 80 mg via ORAL
  Filled 2014-07-20 (×4): qty 1

## 2014-07-20 MED ORDER — SODIUM CHLORIDE 0.9 % IJ SOLN
INTRAMUSCULAR | Status: AC
  Filled 2014-07-20: qty 20

## 2014-07-20 SURGICAL SUPPLY — 36 items
BLADE SURG 10 STRL SS (BLADE) ×6 IMPLANT
CLAMP CORD UMBIL (MISCELLANEOUS) IMPLANT
CLOTH BEACON ORANGE TIMEOUT ST (SAFETY) ×3 IMPLANT
CONTAINER PREFILL 10% NBF 15ML (MISCELLANEOUS) IMPLANT
DRAPE LG THREE QUARTER DISP (DRAPES) IMPLANT
DRSG OPSITE POSTOP 4X10 (GAUZE/BANDAGES/DRESSINGS) ×3 IMPLANT
DURAPREP 26ML APPLICATOR (WOUND CARE) ×3 IMPLANT
ELECT REM PT RETURN 9FT ADLT (ELECTROSURGICAL) ×3
ELECTRODE REM PT RTRN 9FT ADLT (ELECTROSURGICAL) ×1 IMPLANT
EXTRACTOR VACUUM M CUP 4 TUBE (SUCTIONS) IMPLANT
EXTRACTOR VACUUM M CUP 4' TUBE (SUCTIONS)
GLOVE BIO SURGEON STRL SZ7.5 (GLOVE) ×3 IMPLANT
GOWN STRL REUS W/TWL LRG LVL3 (GOWN DISPOSABLE) ×6 IMPLANT
KIT ABG SYR 3ML LUER SLIP (SYRINGE) IMPLANT
NEEDLE HYPO 25X1 1.5 SAFETY (NEEDLE) ×3 IMPLANT
NEEDLE HYPO 25X5/8 SAFETYGLIDE (NEEDLE) IMPLANT
NEEDLE SPNL 20GX3.5 QUINCKE YW (NEEDLE) IMPLANT
NS IRRIG 1000ML POUR BTL (IV SOLUTION) ×3 IMPLANT
PACK C SECTION WH (CUSTOM PROCEDURE TRAY) ×3 IMPLANT
PAD ABD 7.5X8 STRL (GAUZE/BANDAGES/DRESSINGS) ×3 IMPLANT
SPONGE GAUZE 4X4 12PLY STER LF (GAUZE/BANDAGES/DRESSINGS) ×3 IMPLANT
STAPLER VISISTAT 35W (STAPLE) IMPLANT
SUT MNCRL 0 VIOLET CTX 36 (SUTURE) ×2 IMPLANT
SUT MNCRL AB 3-0 PS2 27 (SUTURE) IMPLANT
SUT MON AB 2-0 CT1 27 (SUTURE) ×3 IMPLANT
SUT MON AB-0 CT1 36 (SUTURE) ×6 IMPLANT
SUT MONOCRYL 0 CTX 36 (SUTURE) ×4
SUT PLAIN 0 NONE (SUTURE) IMPLANT
SUT PLAIN 2 0 (SUTURE)
SUT PLAIN 2 0 XLH (SUTURE) IMPLANT
SUT PLAIN ABS 2-0 CT1 27XMFL (SUTURE) IMPLANT
SYRINGE 20CC LL (MISCELLANEOUS) IMPLANT
SYRINGE CONTROL L 12CC (SYRINGE) ×3 IMPLANT
TOWEL OR 17X24 6PK STRL BLUE (TOWEL DISPOSABLE) ×3 IMPLANT
TRAY FOLEY CATH 14FR (SET/KITS/TRAYS/PACK) ×3 IMPLANT
WATER STERILE IRR 1000ML POUR (IV SOLUTION) ×3 IMPLANT

## 2014-07-20 NOTE — H&P (Signed)
[name] is a 33 y.o. female presenting for labor.  Maternal Medical History:  Reason for admission: Contractions.   Contractions: Onset was less than 1 hour ago.   Frequency: regular.    Fetal activity: Perceived fetal activity is normal.   Last perceived fetal movement was within the past hour.    Prenatal complications: no prenatal complications Prenatal Complications - Diabetes: none.    OB History   Grav Para Term Preterm Abortions TAB SAB Ect Mult Living   4 3        3      Past Medical History  Diagnosis Date  . Vaginal Pap smear, abnormal   . Fibromyalgia   . GERD (gastroesophageal reflux disease)   . Back pain affecting pregnancy    Past Surgical History  Procedure Laterality Date  . Cesarean section    . Leep     Family History: family history is not on file. Social History:  reports that she has been smoking Cigarettes.  She has been smoking about 0.25 packs per day. She has never used smokeless tobacco. She reports that she uses illicit drugs. She reports that she does not drink alcohol.   Prenatal Transfer Tool  Maternal Diabetes: No Genetic Screening: Normal Maternal Ultrasounds/Referrals: Normal Fetal Ultrasounds or other Referrals:  None Maternal Substance Abuse:  Yes:  Type: Methadone Significant Maternal Medications:  None Significant Maternal Lab Results:  None Other Comments:  History of Fibromyalgia with percocet use for pain. Use of Methadone for Percocet withdrawal.  Review of Systems  All other systems reviewed and are negative.   Dilation: 2 Effacement (%): 100 Station: -1 Exam by:: S. Carrera, RNC Blood pressure 117/65, pulse 81, temperature 97.9 F (36.6 C), temperature source Oral, resp. rate 18, height 5' (1.524 m), weight 65.227 kg (143 lb 12.8 oz). Maternal Exam:  Uterine Assessment: Contraction strength is moderate.  Contraction frequency is regular.   Abdomen: Patient reports no abdominal tenderness. Surgical scars:  low transverse.   Fetal presentation: vertex  Introitus: Normal vulva. Normal vagina.  Ferning test: negative.  Nitrazine test: negative. Amniotic fluid character: not assessed.  Pelvis: questionable for delivery.   Cervix: Cervix evaluated by digital exam.     Physical Exam  Nursing note and vitals reviewed. Constitutional: She is oriented to person, place, and time. She appears well-developed and well-nourished.  HENT:  Head: Normocephalic and atraumatic.  Neck: Normal range of motion.  Cardiovascular: Normal rate, regular rhythm and normal heart sounds.   Respiratory: Effort normal and breath sounds normal.  GI: Soft. Bowel sounds are normal.  Genitourinary: Vagina normal and uterus normal.  Musculoskeletal: Normal range of motion.  Neurological: She is alert and oriented to person, place, and time.  Skin: Skin is warm and dry.  Psychiatric: She has a normal mood and affect. Her behavior is normal. Thought content normal.    Prenatal labs: ABO, Rh: O/Positive/-- (01/29 1137) Antibody: Negative (01/29 1137) Rubella:   RPR: Nonreactive (01/29 1137)  HBsAg:    HIV: Non-reactive (01/29 1137)  GBS: Negative (07/30 0000)   Assessment/Plan: 39 weeks Active labor Methadone use Previous Csection for rpt.  For TL Consent done.   [name] 07/20/2014, 6:01 PM

## 2014-07-20 NOTE — Op Note (Signed)
Cesarean Section Procedure Note  Indications: previous uterine incision kerr x one and TL  Pre-operative Diagnosis: 39 week 0 day pregnancy.  Post-operative Diagnosis: same  Surgeon: Lenoard AdenAAVON,Nashla Althoff J   Assistants: Arita Missawson, CNM  Anesthesia: Local anesthesia 0.25.% bupivacaine and Spinal anesthesia  ASA Class: 2  Procedure Details  The patient was seen in the Holding Room. The risks, benefits, complications, treatment options, and expected outcomes were discussed with the patient.  The patient concurred with the proposed plan, giving informed consent. The risks of anesthesia, infection, bleeding and possible injury to other organs discussed. Injury to bowel, bladder, or ureter with possible need for repair discussed. Possible need for transfusion with secondary risks of hepatitis or HIV acquisition discussed. Post operative complications to include but not limited to DVT, PE and Pneumonia noted. The site of surgery properly noted/marked. The patient was taken to Operating Room # 9, identified as [name] and the procedure verified as C-Section Delivery. A Time Out was held and the above information confirmed.  After induction of anesthesia, the patient was draped and prepped in the usual sterile manner. A Pfannenstiel incision was made and carried down through the subcutaneous tissue to the fascia. Fascial incision was made and extended transversely using Mayo scissors. The fascia was separated from the underlying rectus tissue superiorly and inferiorly. The peritoneum was identified and entered. Peritoneal incision was extended longitudinally. The utero-vesical peritoneal reflection was incised transversely and the bladder flap was bluntly freed from the lower uterine segment. A low transverse uterine incision(Kerr hysterotomy) was made. Delivered from OT presentation was a  female with Apgar scores of 9 at one minute and 9 at five minutes. Bulb suctioning gently performed. Neonatal team in  attendance.After the umbilical cord was clamped and cut cord blood was obtained for evaluation. The placenta was removed intact and appeared normal. The uterus was curetted with a dry lap pack. Good hemostasis was noted.The uterine outline, tubes and ovaries appeared normal. The uterine incision was closed with running locked sutures of 0 Monocryl x 1 layers. Hemostasis was observed. Lavage was carried out until clear. Tubal ligation with modified Pomeroy method.The parietal peritoneum was closed with a running 2-0 Monocryl suture. The fascia was then reapproximated with running sutures of 0 Monocryl. The skin was reapproximated with staples after [place] closure with 2-0 plain.  Instrument, sponge, and needle counts were correct prior the abdominal closure and at the conclusion of the case.   Findings: As noted  Estimated Blood Loss:  300 mL         Drains: foley                 Specimens: placenta and tubal segments                 Complications:  None; patient tolerated the procedure well.         Disposition: PACU - hemodynamically stable.         Condition: stable  Attending Attestation: I performed the procedure.

## 2014-07-20 NOTE — Anesthesia Procedure Notes (Signed)
Spinal  Patient location during procedure: OR Start time: 07/20/2014 6:28 PM Staffing Anesthesiologist: Karyss Frese A. Performed by: anesthesiologist  Preanesthetic Checklist Completed: patient identified, site marked, surgical consent, pre-op evaluation, timeout performed, IV checked, risks and benefits discussed and monitors and equipment checked Spinal Block Patient position: sitting Prep: site prepped and draped and DuraPrep Patient monitoring: heart rate, cardiac monitor, continuous pulse ox and blood pressure Approach: midline Location: L3-4 Injection technique: single-shot Needle Needle type: Sprotte  Needle gauge: 24 G Needle length: 9 cm Needle insertion depth: 5 cm Assessment Sensory level: T4 Additional Notes Patient tolerated procedure well. Adequate sensory level.

## 2014-07-20 NOTE — MAU Note (Signed)
UC's since 0430. No leaking or bleeding. For repeat C/S.

## 2014-07-20 NOTE — Anesthesia Preprocedure Evaluation (Addendum)
Anesthesia Evaluation  Patient identified by MRN, date of birth, ID band Patient awake    Reviewed: Allergy & Precautions, H&P , NPO status , Patient's Chart, lab work & pertinent test results  Airway Mallampati: II TM Distance: >3 FB Neck ROM: Full    Dental no notable dental hx. (+) Chipped,    Pulmonary Current Smoker,  breath sounds clear to auscultation  Pulmonary exam normal       Cardiovascular negative cardio ROS  Rhythm:Regular Rate:Normal     Neuro/Psych  Neuromuscular disease negative psych ROS   GI/Hepatic Neg liver ROS, GERD-  Medicated and Controlled,  Endo/Other  negative endocrine ROS  Renal/GU negative Renal ROS  negative genitourinary   Musculoskeletal  (+) Fibromyalgia -, narcotic dependent  Abdominal   Peds  Hematology negative hematology ROS (+)   Anesthesia Other Findings   Reproductive/Obstetrics (+) Pregnancy Previous C/Section x 1                           Anesthesia Physical Anesthesia Plan  ASA: II and emergent  Anesthesia Plan: Spinal   Post-op Pain Management:    Induction:   Airway Management Planned: Natural Airway  Additional Equipment:   Intra-op Plan:   Post-operative Plan:   Informed Consent: I have reviewed the patients History and Physical, chart, labs and discussed the procedure including the risks, benefits and alternatives for the proposed anesthesia with the patient or authorized representative who has indicated his/her understanding and acceptance.     Plan Discussed with: Anesthesiologist  Anesthesia Plan Comments:         Anesthesia Quick Evaluation

## 2014-07-20 NOTE — Anesthesia Postprocedure Evaluation (Signed)
Anesthesia Post Note  Patient: [name]  Procedure(s) Performed: Procedure(s) (LRB): CESAREAN SECTION (N/A)  Anesthesia type: Spinal  Patient location: PACU  Post pain: Pain level controlled  Post assessment: Post-op Vital signs reviewed  Last Vitals:  Filed Vitals:   07/20/14 1636  BP: 117/65  Pulse: 81  Temp: 36.6 C  Resp: 18    Post vital signs: Reviewed  Level of consciousness: awake  Complications: No apparent anesthesia complications

## 2014-07-20 NOTE — Transfer of Care (Signed)
Immediate Anesthesia Transfer of Care Note  Patient: [name]  Procedure(s) Performed: Procedure(s): CESAREAN SECTION (N/A)  Patient Location: PACU  Anesthesia Type:Spinal  Level of Consciousness: awake, alert  and oriented  Airway & Oxygen Therapy: Patient Spontanous Breathing  Post-op Assessment: Report given to PACU RN and Post -op Vital signs reviewed and stable  Post vital signs: Reviewed and stable  Complications: No apparent anesthesia complications

## 2014-07-20 NOTE — Consult Note (Signed)
Neonatology Note:  Attendance at C-section:  I was asked by Dr. Taavon to attend this repeat C/S at term after onset of labor. The mother is a G4P3 O pos, GBS neg with fibromyalgia (on Methadone 10 mg daily for Percocet withdrawal). ROM at delivery, fluid clear. Infant vigorous with good spontaneous cry and tone. Needed bulb suctioning. Ap 9/9. Lungs clear to ausc in DR. To CN to care of Pediatrician.  Avante Carneiro C. Shylie Polo, MD  

## 2014-07-21 ENCOUNTER — Encounter (HOSPITAL_COMMUNITY): Payer: Self-pay

## 2014-07-21 LAB — CBC
HCT: 29.1 % — ABNORMAL LOW (ref 36.0–46.0)
Hemoglobin: 9.8 g/dL — ABNORMAL LOW (ref 12.0–15.0)
MCH: 30.5 pg (ref 26.0–34.0)
MCHC: 33 g/dL (ref 30.0–36.0)
MCV: 92.4 fL (ref 78.0–100.0)
Platelets: 295 10*3/uL (ref 150–400)
RBC: 3.15 MIL/uL — ABNORMAL LOW (ref 3.87–5.11)
RDW: 13.7 % (ref 11.5–15.5)
WBC: 12.4 10*3/uL — ABNORMAL HIGH (ref 4.0–10.5)

## 2014-07-21 LAB — RPR

## 2014-07-21 MED ORDER — MAGNESIUM OXIDE 400 (241.3 MG) MG PO TABS
200.0000 mg | ORAL_TABLET | Freq: Every day | ORAL | Status: DC
  Administered 2014-07-21: 400 mg via ORAL
  Administered 2014-07-22 – 2014-07-24 (×3): 200 mg via ORAL
  Filled 2014-07-21 (×6): qty 0.5

## 2014-07-21 MED ORDER — POLYSACCHARIDE IRON COMPLEX 150 MG PO CAPS
150.0000 mg | ORAL_CAPSULE | Freq: Two times a day (BID) | ORAL | Status: DC
  Administered 2014-07-21 – 2014-07-24 (×7): 150 mg via ORAL
  Filled 2014-07-21 (×7): qty 1

## 2014-07-21 MED ORDER — PNEUMOCOCCAL VAC POLYVALENT 25 MCG/0.5ML IJ INJ: 0.5000 mL | INJECTION | INTRAMUSCULAR | Status: DC

## 2014-07-21 MED ORDER — OXYCODONE-ACETAMINOPHEN 5-325 MG PO TABS
1.0000 | ORAL_TABLET | ORAL | Status: DC | PRN
  Administered 2014-07-21 – 2014-07-23 (×7): 2 via ORAL
  Filled 2014-07-21 (×7): qty 2

## 2014-07-21 NOTE — Progress Notes (Signed)
Clinical Social Work Department PSYCHOSOCIAL ASSESSMENT - MATERNAL/CHILD 07/21/2014  Patient:  [name], [name]  Account Number:  [id number]  Admit Date:  07/20/2014  Marjo Bicker Name:   Alla Feeling    Clinical Social Worker:  Louisa Favaro, LCSW   Date/Time:  07/21/2014 03:15 PM  Date Referred:  07/20/2014      Referred reason  Psychosocial assessment   Other referral source:    I:  FAMILY / HOME ENVIRONMENT Child's legal guardian:  PARENT  Guardian - Name Guardian - Age Guardian - Address  Shimamoto,Caci M [address]  [place], [place] [address]  Tiburcio Pea, Derick  same as above   Other household support members/support persons Other support:   Family is reportedly supportive    II  PSYCHOSOCIAL DATA Information Source:    Event organiser Employment:   Surveyor, quantity resources:  Media planner If [employer]:    School / Grade:   Maternity Care Coordinator / Child Services Coordination / Early Interventions:  Cultural issues impacting care:    III  STRENGTHS Strengths  Supportive family/friends  Home prepared for Child (including basic supplies)  Adequate Resources   Strength comment:    IV  RISK FACTORS AND CURRENT PROBLEMS Current Problem:       V  SOCIAL WORK ASSESSMENT Acknowledged order for Social Work consult.  Mother is on methadone for Percocet withdrawal.  Parents are married and have 3 other dependents ages10, 7, and 4.  Mother states that she began taking Percocet about 2 years ago for fibromyalgia and decided to stop the medication when she became pregnant.  However, her physician stated that it was too dangerous to just stop the medication.  Informed that decision was made to transition to methadone, then eventually wean off.   She denies any illicit drug use during pregnancy.  Mother reports hx of mental illness. Informed that it started with the pregnancy of her previous child when she was diagnosed with retinal hemorrhage and lost  complete vision in that eye for a period of time. Informed that she began having panic attacks and was prescribed Xanax and klonopin which she took for a short period of time and stopped because it made her drowsy and sleepy all the time.  She was also prescribe Prozac about a year ago.  She denies any current symptoms of depression or anxiety.  Informed that her depression improved when she got off the five medications she was on.  Mother states that she did not realize how addicting the Percocet was when she started the medication.  She was very pleasant and receptive to CSW.  Mother was informed of the hospital's drug screen policy.  UDS on newborn was negative.    No acute social concerns noted at this time.  Mother informed of social work Surveyor, mining.      VI SOCIAL WORK PLAN Social Work Plan   No Barriers to Discharge   Type of pt/family education:   PP Depression   If child protective services report - county:   If child protective services report - date:   Information/referral to community resources comment:   Other social work plan:   Will continue to monitor drug screen

## 2014-07-21 NOTE — Plan of Care (Signed)
Problem: Phase I Progression Outcomes Goal: Pain controlled with appropriate interventions Outcome: Completed/Met Date Met:  07/21/14 Patient currently has Dilaudid PCA pump

## 2014-07-21 NOTE — Lactation Note (Signed)
This note was copied from the chart of Connie Georg RuddleDeanna Reyes. Lactation Consultation Note  Patient Name: [name] BJYNW'GToday's Date: 07/21/2014 Reason for consult: Follow-up assessment  Mom was attempting to latch baby but baby was very fussy at the breast. Baby was not able to obtain latch. LC noted on suck exam that baby is not able to organize a suckling pattern, baby tongue thrusts, chews, bites. LC attempted suck training and then assisted Mom with positioning and trying to help baby sustain a latch. Baby could not sustain the latch and would come off the breast, gaggy with high pitched cry, jittery. LC placed baby STS on Mom, baby calmed within few minutes. After about 10-15 minutes, LC initiated #16 nipple shield to see if baby could coordinate her suck, she again demonstrated tongue thrusting, chewing, would occasional start a suckling pattern for 1-2 sucks then stop. Pre-filled nipple shield with some formula but this did not help with latch and baby was getting more agitated at the breast. Placed baby STS again on Mom and discussed feeding plan with parents. 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 Advised Mom to start supplementing with bottle using EBM or formula whichever is available. Advised parents they could do suck training with bottle to help baby coordinate her suck and we could try breast again tomorrow if baby has better suck pattern. Parents were agreeable to this plan. LC attempted to demonstrate suck training with bottle. Baby has difficulty taking bottle as well due to dis-organization of suck and becomes gaggy. Baby took 5 ml of Gerber formula over 25-30 minutes. Advised parents that since baby is only able to take small amounts from bottle they will need to feed baby more frequently till she can  tolerate larger amounts. Advised to supplement every 2 hours with at least 5-10 ml, more if baby will take it. Mom is to pump every 3 hours on Preemie setting to encourage milk production. RN aware of plan.  Maternal Data Formula Feeding for Exclusion: No Has patient been taught Hand Expression?: Yes Does the patient have breastfeeding experience prior to this delivery?: Yes  Feeding Feeding Type: Bottle Fed - Formula Length of feed: 0 min  LATCH Score/Interventions Latch: Repeated attempts needed to sustain latch, nipple held in mouth throughout feeding, stimulation needed to elicit sucking reflex. Intervention(s): Assist with latch;Breast massage;Adjust position;Breast compression  Audible Swallowing: None  Type of Nipple: Everted at rest and after stimulation  Comfort (Breast/Nipple): Soft / non-tender     Hold (Positioning): Assistance needed to correctly position infant at breast and maintain latch. Intervention(s): Breastfeeding basics reviewed;Support Pillows;Position options;Skin to skin  LATCH Score: 6  Lactation Tools Discussed/Used Tools: Pump;Nipple Shields Nipple shield size: 16;20 Breast pump type: Double-Electric Breast Pump   Consult Status Consult Status: Follow-up Date: 07/22/14 Follow-up type: In-patient    Connie LevinsGranger, [name] 07/21/2014, 6:13 PM

## 2014-07-21 NOTE — Progress Notes (Signed)
POD # 1  Subjective: Pt reports feeling well/ Pain controlled with Motrin and Dilaudid PCA Tolerating po/ Foley patent to SD/ No n/v/ Flatus present Activity: up with assistance Bleeding is light Newborn info:  Information for the patient's newborn:  Thomasene MohairHarris, Girl Kalen [[id number]][[id number]]  female Feeding: breast   Objective:  VS:  Filed Vitals:   07/21/14 0619 07/21/14 0640 07/21/14 0800 07/21/14 0928  BP:  110/54 95/42   Pulse:  68 59   Temp:  99.2 F (37.3 C) 99 F (37.2 C)   TempSrc:  Oral Oral   Resp: 16 16 16 16   Height:      Weight:      SpO2: 96% 96% 98%      I&O: Intake/Output     08/21 0701 - 08/22 0700 08/22 0701 - 08/23 0700   I.V. (mL/kg) 1835.7 (28.1)    Total Intake(mL/kg) 1835.7 (28.1)    Urine (mL/kg/hr) 1250    Blood 600    Total Output 1850     Net -14.3             Recent Labs  07/20/14 1740 07/21/14 0549  WBC 15.8* 12.4*  HGB 11.8* 9.8*  HCT 34.2* 29.1*  PLT 334 295    Blood type: --/--/O POS, O POS (08/21 1740) Rubella:      Physical Exam:  General: alert and cooperative CV: Regular rate and rhythm Resp: CTA bilaterally Abdomen: soft, nontender, normal bowel sounds Incision: pressure dsg c/d/i Uterine Fundus: firm, below umbilicus, nontender Lochia: minimal Ext: extremities normal, atraumatic, no cyanosis or edema and Homans sign is negative, no sign of DVT    Assessment: POD # 1/ G4P4004/ S/P C/Section d/t repeat in labor Narcotic dependence IDA with compounding ABL anemia Doing well  Plan: Continue Methadone D/c PCA, start Percocet Start Niferex and Magnesium oxide Ambulate Continue routine post op orders Consulted with Dr. Billy Coastaavon on pain med mngt   Signed: Donette LarryBHAMBRI, Valeta Paz, Dorris CarnesN, MSN, CNM 07/21/2014, 10:11 AM

## 2014-07-22 DIAGNOSIS — D509 Iron deficiency anemia, unspecified: Secondary | ICD-10-CM | POA: Diagnosis present

## 2014-07-22 DIAGNOSIS — D62 Acute posthemorrhagic anemia: Secondary | ICD-10-CM | POA: Diagnosis not present

## 2014-07-22 DIAGNOSIS — F1120 Opioid dependence, uncomplicated: Secondary | ICD-10-CM | POA: Diagnosis present

## 2014-07-22 MED ORDER — IBUPROFEN 600 MG PO TABS: 600.0000 mg | ORAL_TABLET | Freq: Four times a day (QID) | ORAL | Status: DC

## 2014-07-22 MED ORDER — OXYCODONE-ACETAMINOPHEN 5-325 MG PO TABS: 1.0000 | ORAL_TABLET | ORAL | Status: DC | PRN

## 2014-07-22 MED ORDER — PNEUMOCOCCAL VAC POLYVALENT 25 MCG/0.5ML IJ INJ
0.5000 mL | INJECTION | Freq: Once | INTRAMUSCULAR | Status: AC
  Administered 2014-07-22: 0.5 mL via INTRAMUSCULAR
  Filled 2014-07-22: qty 0.5

## 2014-07-22 MED ORDER — POLYSACCHARIDE IRON COMPLEX 150 MG PO CAPS: 150.0000 mg | ORAL_CAPSULE | Freq: Two times a day (BID) | ORAL | Status: DC

## 2014-07-22 MED ORDER — MAGNESIUM OXIDE 400 (241.3 MG) MG PO TABS: 200.0000 mg | ORAL_TABLET | Freq: Every day | ORAL | Status: AC

## 2014-07-22 NOTE — Lactation Note (Signed)
This note was copied from the chart of [name]. Lactation Consultation Note  Patient Name: [name] Date: 07/22/2014 Reason for consult: Follow-up assessment Attempted to help Mom re-introduce baby to breast. Mom reports baby is taking bottle better today. At present baby is sucking vigorously on pacifier. Attempted to latch baby without nipple shield but baby could not coordinate suckling pattern. Used #20 nipple shield but baby still could not coordinate suck. After several attempts, baby became very agitated so Mom and LC decided it best at this time to continue pump/bottle feeding per previous plan. Mom reports she is pumping consistently. She received 1 ml from right breast at this visit and this was given to baby. LC notes baby still bites and chews with suck exam, takes her few minutes to organize her suck on LC finger and pacifier. With the bottle it is the same experience. It takes her few minutes to organize her suck, she will have a few consistent suckles then stop. At times is gaggy. Baby took 10 ml in 20 minutes at this visit then spit up. LC advised Mom to let baby have break for 15-20 minutes then try to give remaining 15 ml of formula. Mom reports baby has not spit up today, LC advised may be because she became so agitated at the breast. Mom reports she agrees with plan at this time. Advised Mom that if we don't get baby to breast while in hospital, to schedule OP follow up. Advised Mom to give EBM whenever available to help minimize withdrawal symptoms. LC advised to have FOB bring Dr. Letta Pate with Preemie nipple as this has slower flow to nipple with valve system and this may help baby with feedings.   Maternal Data    Feeding Feeding Type: Breast Fed Nipple Type: Slow - flow Length of feed: 0 min  LATCH Score/Interventions Latch: Too sleepy or reluctant, no latch achieved, no sucking elicited.  Audible Swallowing: None  Type of Nipple: Everted  at rest and after stimulation  Comfort (Breast/Nipple): Soft / non-tender  Interventions (Mild/moderate discomfort): Post-pump  Hold (Positioning): Assistance needed to correctly position infant at breast and maintain latch.  LATCH Score: 5  Lactation Tools Discussed/Used     Consult Status Consult Status: Follow-up Date: 07/23/14 Follow-up type: In-patient    Alfred Levins 07/22/2014, 8:45 PM

## 2014-07-22 NOTE — Progress Notes (Signed)
POD # 2  Subjective: Pt reports feeling well, desires early discharge/ Pain controlled with Motrin and Percocet Tolerating po/Voiding without problems/ No n/v/ Flatus present yesterday Activity: ad lib Bleeding is light Newborn info:  Information for the patient's newborn:  Dontasia, Miranda [[id number]]  female Feeding: breast   Objective: VS: VS:  Filed Vitals:   07/21/14 1225 07/21/14 1630 07/21/14 1900 07/22/14 0555  BP: 112/54 92/48 98/44  102/57  Pulse: 66 58 54 68  Temp: 98.9 F (37.2 C) 98.9 F (37.2 C) 98.8 F (37.1 C) 98.5 F (36.9 C)  TempSrc: Oral Oral Oral Oral  Resp: Height:      Weight:      SpO2: 98% 97% 96%     I&O: Intake/Output     08/22 0701 - 08/23 0700 08/23 0701 - 08/24 0700   P.O. 1200    I.V. (mL/kg) 400 (6.1)    Total Intake(mL/kg) 1600 (24.5)    Urine (mL/kg/hr) 1425 (0.9)    Blood     Total Output 1425     Net +175            LABS:  Recent Labs  07/20/14 1740 07/21/14 0549  WBC 15.8* 12.4*  HGB 11.8* 9.8*  PLT 334 295                           Physical Exam:  General: alert and cooperative CV: Regular rate and rhythm Resp: CTA bilaterally Abdomen: soft, nontender, normal bowel sounds Incision: healing well, no erythema, no hernia, no seroma, no swelling, well approximated, scant drk drainage on honeycomb dsg Uterine Fundus: firm, below umbilicus, nontender Lochia: minimal Ext: extremities normal, atraumatic, no cyanosis or edema and Homans sign is negative, no sign of DVT    Assessment: POD # 2/ G4P4004/ S/P C/Section d/t repeat in labor Narcotic dependence IDA with compounding ABL anemia Doing well and stable for discharge home  Plan: Discharge home RX's: Ibuprofen  po Q 6 hrs prn pain #30 Refill x 1 Percocet 5/325 1 - 2 tabs po every 4 hrs prn pain #30 Refill x 0 Niferex  po BID #60 Refill x 1 Mag Oxide 200 mg po daily Resume tx at Methadone clinic [facility] booklet  given    Signed: Donette Larry, Dorris Carnes, MSN, CNM 07/22/2014, 11:02 AM

## 2014-07-23 ENCOUNTER — Encounter (HOSPITAL_COMMUNITY): Payer: Self-pay | Admitting: Obstetrics and Gynecology

## 2014-07-23 ENCOUNTER — Inpatient Hospital Stay (HOSPITAL_COMMUNITY): Admission: RE | Admit: 2014-07-23 | Payer: BC Managed Care – PPO | Source: Ambulatory Visit

## 2014-07-23 MED ORDER — MEASLES, MUMPS & RUBELLA VAC ~~LOC~~ INJ
0.5000 mL | INJECTION | Freq: Once | SUBCUTANEOUS | Status: DC
  Filled 2014-07-23: qty 0.5

## 2014-07-23 MED ORDER — IBUPROFEN 800 MG PO TABS
800.0000 mg | ORAL_TABLET | Freq: Three times a day (TID) | ORAL | Status: DC
  Administered 2014-07-24 (×2): 800 mg via ORAL
  Filled 2014-07-23 (×2): qty 1

## 2014-07-23 MED ORDER — OXYCODONE-ACETAMINOPHEN 5-325 MG PO TABS
1.0000 | ORAL_TABLET | ORAL | Status: DC | PRN
  Administered 2014-07-23 – 2014-07-24 (×2): 1 via ORAL
  Filled 2014-07-23 (×2): qty 1

## 2014-07-23 MED ORDER — IBUPROFEN 800 MG PO TABS
800.0000 mg | ORAL_TABLET | Freq: Four times a day (QID) | ORAL | Status: DC
  Administered 2014-07-23 (×2): 800 mg via ORAL
  Filled 2014-07-23 (×2): qty 1

## 2014-07-23 NOTE — Progress Notes (Addendum)
POSTOPERATIVE DAY # 3 S/P CS   S:         Reports feeling sore and stiff              Tolerating po intake / no nausea / no vomiting / + flatus / no BM             Bleeding is light             Pain controlled with Motrin and percocet and methadone             Up ad lib / ambulatory/ voiding QS  Newborn bottle feeding               Sees Jameika at [facility] - will need apt there next day after discharge for methadone dose                O:  VS: BP 108/57  Pulse 58  Temp(Src) 98.5 F (36.9 C) (Oral)  Resp 20  Ht 5' (1.524 m)  Wt 65.227 kg (143 lb 12.8 oz)  BMI 28.08 kg/m2  SpO2 96%  Breastfeeding? Unknown   LABS:               Recent Labs  07/20/14 1740 07/21/14 0549  WBC 15.8* 12.4*  HGB 11.8* 9.8*  PLT 334 295               Bloodtype: --/--/O POS, O POS (08/21 1740)  Rubella:                                                         Physical Exam:             Alert and Oriented X3  Lungs: Clear and unlabored  Heart: regular rate and rhythm / no mumurs  Abdomen: soft, non-tender, non-distended, active BS             Fundus: firm, non-tender, U-1             Dressing intact honeycomb              Incision:  approximated with sutures / no erythema / no ecchymosis / no drainage  Perineum: intact  Lochia: light  Extremities: no edema, no calf pain or tenderness, negative Homans             No plan postop for pain management in prenatal record - no notes from [facility] for preferred pain management  A:        POD # 3 S/P CS            Narcotic abuse hx - attending [facility] during pregnancy                                                       (drug of choice percocet)  P:        Routine postoperative care                TC to [facility] - spoke with RN Alcario Drought Arnaldo Natal on vacation this week) - states ok for short term percocet since already initiated that medication -  prefer only 3 day supply at discharge Patient must bring  documentation in her discharge record of last dose and amount of Methadone in hospital and documentation of RX given for Percocet  Will increase Motrin dose to  to decrease need for Percocet Pending DC plan from Peds today - will coordinate DC this afternoon after Peds decision on baby discharge     Marlinda Mike CNM, MSN, [facility] 07/23/2014, 9:08 AM   Pt seen, agree w/ above plan. Pain controlled all day today on scheduled NSAID and 2 Percocet this am. D/w pt her h/o narcotic addiction. Pt has been doing well at [employer]. OK to continue Percocet- give #20 at d/c and pt aware she should not need more than 1-2 wks of Percocet. Will continue to receive methadone.   Evy Lutterman A. 07/23/2014 7:03 PM

## 2014-07-23 NOTE — Lactation Note (Signed)
This note was copied from the chart of Girl Haydin Dunn. Lactation Consultation Note  Patient Name: Girl Cherl Gorney ZHYQM'V Date: 07/23/2014 Reason for consult: Follow-up assessment;Other (Comment) (baby going through withdrawal)  Mother is on methadone due to narcotic addition with medical condition. Mother states she has been on methadone for 4 months. She is an experienced breastfeeding mother and successfully breast fed her other 3 children for 6 months with a good supply and did not need to supplement with formula. She thinks her breast are heavier, fuller today and was able to express 5 ml. Mother mixed her expressed milk with formula and fed to baby via bottle. Recommended that mother give expressed milk only at the start of the feeding to allow baby to take in her milk with methadone to decrease with withdrawal symptoms. After baby drinks her expressed milk, then add formula to the bottle to complete the feeding. Mother is hopeful that her milk volume will increase today and she reduce formula. Discussed pumping q 2-3 hours followed by hand expression to stimulate and increase supply.  Mother states she has been attempting to latch baby when she is calm but has not been successful yet. Baby was fussy when seeing mother in the room. Mother was calm and soothing to her baby, she demonstrated pace feeding with the bottle, baby tolerated the bottle feeding well and then mother snuggled baby next to her stroking her head. Mother plans to and is hopeful that baby will be able to to go to the breast for feedings once baby overcomes withdrawals. Mother has WIC. LC will send a fax referral for mother to obtain a DEBP after discharge to support continued pumping.  Mother will call today to make an appointment with [facility] after discharge.  Mother to call for RN/ [facility] assistance with latch if baby showing cues to latch.  Maternal Data    Feeding Nipple Type: Slow - flow Length of feed: 15 min (for baby  to take 30 ml from bottle)  LATCH Score/Interventions                      Lactation Tools Discussed/Used WIC Program: Yes Pump Review: Other (comment) (already set up and using) Date initiated:: 07/22/14   Consult Status Consult Status: Follow-up Date: 07/24/14 Follow-up type: In-patient    Christella Hartigan M 07/23/2014, 10:56 AM

## 2014-07-24 MED ORDER — OXYCODONE-ACETAMINOPHEN 5-325 MG PO TABS: 1.0000 | ORAL_TABLET | ORAL | Status: AC | PRN

## 2014-07-24 MED ORDER — IBUPROFEN 800 MG PO TABS
800.0000 mg | ORAL_TABLET | Freq: Three times a day (TID) | ORAL | Status: AC
Start: 2014-07-24 — End: ?

## 2014-07-24 NOTE — Plan of Care (Signed)
Problem: Discharge Progression Outcomes Goal: Barriers To Progression Addressed/Resolved Outcome: Progressing Mom- hx- drug addiction-on Methadone

## 2014-07-24 NOTE — Progress Notes (Signed)
Patient ID: [name], female   DOB: 11-19-1981, 33 y.o.   MRN: [id number] Subjective: POD# 4 Information for the patient's newborn:  [name], [place] [[id number]]  female  Reports feeling well Feeding: breast and bottle Patient reports tolerating PO.  Breast symptoms: none Pain controlled with ibuprofen (OTC) and narcotic analgesics including Percocet Denies HA/SOB/C/P/N/V/dizziness. Flatus present. She reports vaginal bleeding as normal, without clots.  She is ambulating, urinating without difficult.     Objective:   VS:  Filed Vitals:   07/22/14 1843 07/23/14 0552 07/23/14 1901 07/24/14 0633  BP: 109/70 108/57 120/70 116/68  Pulse: 62 58 57 59  Temp: 98 F (36.7 C) 98.5 F (36.9 C) 98.1 F (36.7 C) 98.3 F (36.8 C)  TempSrc: Oral Oral Oral Oral  Resp: Height:      Weight:      SpO2:            Blood type: O POS (08/21 1740)  Rubella: Immune     Physical Exam:  General: alert, cooperative and no distress Abdomen: soft, nontender, normal bowel sounds Incision: serous drainage present, small amount on bottom edge of Honeycomb dressing Uterine Fundus: firm, 2 FB below umbilicus, nontender Lochia: minimal Ext: extremities normal, atraumatic, no cyanosis or edema, Homans sign is negative, no sign of DVT and no edema, redness or tenderness in the calves or thighs   Assessment/Plan: 32 y.o.   POD# 4.  s/p Cesarean Delivery.  Indications: repeat and bilateral tubal ligation                Principal Problem:   Postpartum care following cesarean delivery (8/21) Active Problems:   H/O cesarean section   Narcotic dependence   Iron deficiency anemia   Acute blood loss anemia  Doing well, stable.               Regular diet as tolerated Ambulate Routine post-op care D/C today - room-in with infant until tomorrow per pediatrician Follow-up with [facility] for methadone dose 8/26 am, ASAP Percocet 5/325mg  1 tab every 4-6 hrs, Disp #15, No  refills  Kenard Gower, MSN, CNM 07/24/2014, 11:08 AM

## 2014-07-24 NOTE — Discharge Summary (Signed)
POSTOPERATIVE DISCHARGE SUMMARY:  Patient ID: [name] MRN: [id number] DOB/AGE: 1981/02/16 33 y.o.  Admit date: 07/20/2014 Admission Diagnoses: Active Labor   Discharge date:   Discharge Diagnoses: S/P Repeat C/S with BTL due to active labor on 07/20/2014        Prenatal history: G4P1004   EDC : 07/27/2014, by Ultrasound  Has received prenatal care at [facility] since 9.[redacted] wks gestation. Primary provider : Dr. Ernestina Penna Prenatal course complicated by Narcotics dependency, Methadone use, Previous Cesarean deliveries, fibromyalgia  Prenatal Labs: ABO, Rh: O POS, O POS (08/21 1740) Antibody: NEG (08/21 1740) Rubella: Immune   RPR: NON REAC (08/21 1740)  HBsAg: NON REAC HIV: Non-reactive (01/29 1137)  GTT : abnormal 1 hr GTT / normal 3 hr GTT GBS: Negative (07/30 0000)   Medical / Surgical History :  Past medical history:  Past Medical History  Diagnosis Date  . Vaginal Pap smear, abnormal   . Fibromyalgia   . GERD (gastroesophageal reflux disease)   . Back pain affecting pregnancy   . Postpartum care following cesarean delivery (8/21) 07/20/2014    Past surgical history:  Past Surgical History  Procedure Laterality Date  . Cesarean section    . Leep    . Cesarean section N/A 07/20/2014    Procedure: CESAREAN SECTION;  Surgeon: Lenoard Aden, MD;  Location: [facility];  Service: Obstetrics;  Laterality: N/A;     Allergies: Review of patient's allergies indicates no known allergies.   Intrapartum Course:  Admitted for active labor / previous cesarean deliveries - scheduled cesarean for 8/26 / repeat cesarean delivery of viable female infant and bilateral tubal ligation by Dr. Billy Coast   Physical Exam:   VSS: Blood pressure 116/68, pulse 59, temperature 98.3 F (36.8 C), temperature source Oral, resp. rate 20, height 5' (1.524 m), weight 65.227 kg (143 lb 12.8 oz), SpO2 96 %, currently breastfeeding.  LABS:  Recent Results (from the past 2160  hour(s))  OB RESULTS CONSOLE GBS     Status: None   Collection Time    06/28/14 12:00 AM      Result Value Ref Range   GBS Negative    CBC     Status: Abnormal   Collection Time    07/20/14  5:40 PM      Result Value Ref Range   WBC 15.8 (*) 4.0 - 10.5 K/uL   RBC 3.72 (*) 3.87 - 5.11 MIL/uL   Hemoglobin 11.8 (*) 12.0 - 15.0 g/dL   HCT 78.2 (*) 95.6 - 21.3 %   MCV 91.9  78.0 - 100.0 fL   MCH 31.7  26.0 - 34.0 pg   MCHC 34.5  30.0 - 36.0 g/dL   RDW 08.6  57.8 - 46.9 %   Platelets 334  150 - 400 K/uL  RPR     Status: None   Collection Time    07/20/14  5:40 PM      Result Value Ref Range   RPR NON REAC  NON REAC   Comment: Performed at [facility]  TYPE AND SCREEN     Status: None   Collection Time    07/20/14  5:40 PM      Result Value Ref Range   ABO/RH(D) O POS     Antibody Screen NEG     Sample Expiration 07/23/2014    ABO/RH     Status: None   Collection Time    07/20/14  5:40 PM  Result Value Ref Range   ABO/RH(D) O POS    CBC     Status: Abnormal   Collection Time    07/21/14  5:49 AM      Result Value Ref Range   WBC 12.4 (*) 4.0 - 10.5 K/uL   RBC 3.15 (*) 3.87 - 5.11 MIL/uL   Hemoglobin 9.8 (*) 12.0 - 15.0 g/dL   Comment: REPEATED TO VERIFY     DELTA CHECK NOTED   HCT 29.1 (*) 36.0 - 46.0 %   MCV 92.4  78.0 - 100.0 fL   MCH 30.5  26.0 - 34.0 pg   MCHC 33.0  30.0 - 36.0 g/dL   RDW 16.1  09.6 - 04.5 %   Platelets 295  150 - 400 K/uL    Newborn Data Live born female on 07/20/2014 Birth Weight: 7 lb (3175 g) APGAR: 9, 9  See operative report for further details  Home with not discharged today - will remain in room .  Discharge Instructions:  Wound Care: keep clean and dry / remove honeycomb POD 6 Postpartum Instructions: [facility] discharge booklet - instructions reviewed Medications:    Medication List    STOP taking these medications       calcium carbonate 500 MG chewable tablet  Commonly known as:  TUMS - dosed in mg elemental  calcium     ranitidine 150 MG tablet  Commonly known as:  ZANTAC      TAKE these medications       ibuprofen 600 MG tablet  Commonly known as:  ADVIL,MOTRIN  Take 1 tablet (600 mg total) by mouth every 6 (six) hours.     ibuprofen 800 MG tablet  Commonly known as:  ADVIL,MOTRIN  Take 1 tablet (800 mg total) by mouth every 8 (eight) hours.     iron polysaccharides 150 MG capsule  Commonly known as:  NIFEREX  Take 1 capsule (150 mg total) by mouth 2 (two) times daily.     magnesium oxide 400 (241.3 MG) MG tablet  Commonly known as:  MAG-OX  Take 0.5 tablets (200 mg total) by mouth daily.     methadone 10 MG/5ML solution  Commonly known as:  DOLOPHINE  Take 50 mg by mouth daily.     oxyCODONE-acetaminophen 5-325 MG per tablet  Commonly known as:  PERCOCET/ROXICET  Take 1-2 tablets by mouth every 4 (four) hours as needed for severe pain.     oxyCODONE-acetaminophen 5-325 MG per tablet  Commonly known as:  PERCOCET/ROXICET  Take 1 tablet by mouth every 4 (four) hours as needed for severe pain.     prenatal multivitamin Tabs tablet  Take 1 tablet by mouth daily.                             Follow up with Noland Fordyce A., MD. Schedule an appointment as soon as possible for a visit in 6 weeks. (Postpartum visit)    Specialty:  Obstetrics and Gynecology   Contact information:   [address] [place] [address] [phone]       Signed: Kenard Gower, MSN, CNM 07/24/2014, 11:22 AM

## 2014-07-24 NOTE — Discharge Instructions (Signed)
Last Methadone dose 50 mg was given 07/24/2014 @ 0519.  Prescribed Percocet 5/325 mg 1 every 4-6 hours as needed for pain, Disp #15, no refills. Breast Pumping Tips If you are breastfeeding, there may be times when you cannot feed your baby directly. Returning to work or going on a trip are common examples. Pumping allows you to store breast milk and feed it to your baby later.  You may not get much milk when you first start to pump. Your breasts should start to make more after a few days. If you pump at the times you usually feed your baby, you may be able to keep making enough milk to feed your baby without also using formula. The more often you pump, the more milk you will produce. WHEN SHOULD I PUMP?   You can begin to pump soon after delivery. However, some experts recommend waiting about 4 weeks before giving your infant a bottle to make sure breastfeeding is going well.  If you plan to return to work, begin pumping a few weeks before. This will help you develop techniques that work best for you. It also lets you build up a supply of breast milk.   When you are with your infant, feed on demand and pump after each feeding.   When you are away from your infant for several hours, pump for about 15 minutes every 2-3 hours. Pump both breasts at the same time if you can.   If your infant has a formula feeding, make sure to pump around the same time.   If you drink any alcohol, wait 2 hours before pumping.  HOW DO I PREPARE TO PUMP? Your let-down reflexis the natural reaction to stimulation that makes your breast milk flow. It is easier to stimulate this reflex when you are relaxed. Find relaxation techniques that work for you. If you have difficulty with your let-down reflex, try these methods:   Smell one of your infant's blankets or an item of clothing.   Look at a picture or video of your infant.   Sit in a quiet, private space.   Massage the breast you plan to pump.    Place soothing warmth on the breast.   Play relaxing music.  WHAT ARE SOME GENERAL BREAST PUMPING TIPS?  Wash your hands before you pump. You do not need to wash your nipples or breasts.  There are three ways to pump.  You can use your hand to massage and compress your breast.  You can use a handheld manual pump.  You can use an electric pump.   Make sure the suction cup (flange) on the breast pump is the right size. Place the flange directly over the nipple. If it is the wrong size or placed the wrong way, it may be painful and cause nipple damage.   If pumping is uncomfortable, apply a small amount of purified or modified lanolin to your nipple and areola.  If you are using an electric pump, adjust the speed and suction power to be more comfortable.  If pumping is painful or if you are not getting very much milk, you may need a different type of pump. A lactation consultant can help you determine what type of pump to use.   Keep a full water bottle near you at all times. Drinking lots of fluid helps you make more milk.  You can store your milk to use later. Pumped breast milk can be stored in a sealable, sterile container or  plastic bag. Label all stored breast milk with the date you pumped it.  Milk can stay out at room temperature for up to 8 hours.  You can store your milk in the refrigerator for up to 8 days.  You can store your milk in the freezer for 3 months. Thaw frozen milk using warm water. Do not put it in the microwave.  Do not smoke. Smoking can lower your milk supply and harm your infant. If you need help quitting, ask your health care provider to recommend a program.  WHEN SHOULD I CALL MY HEALTH CARE PROVIDER OR A LACTATION CONSULTANT?  You are having trouble pumping.  You are concerned that you are not making enough milk.  You have nipple pain, soreness, or redness.  You want to use birth control. Birth control pills may lower your milk supply.  Talk to your health care provider about your options. Document Released: 05/06/2010 Document Revised: 11/21/2013 Document Reviewed: 09/08/2013 [facility] Patient Information 2015 [place], [place]. This information is not intended to replace advice given to you by your health care provider. Make sure you discuss any questions you have with your health care provider.  Nutrition for the New Mother  A new mother needs good health and nutrition so she can have energy to take care of a new baby. Whether a mother breastfeeds or formula feeds the baby, it is important to have a well-balanced diet. Foods from all the food groups should be chosen to meet the new mother's energy needs and to give her the nutrients needed for repair and healing.  A HEALTHY EATING PLAN The My Pyramid plan for Moms outlines what you should eat to help you and your baby stay healthy. The energy and amount of food you need depends on whether or not you are breastfeeding. If you are breastfeeding you will need more nutrients. If you choose not to breastfeed, your nutrition goal should be to return to a healthy weight. Limiting calories may be needed if you are not breastfeeding.  HOME CARE INSTRUCTIONS   For a personal plan based on your unique needs, see your Registered Dietitian or visit [email or web address].  Eat a variety of foods. The plan below will help guide you. The following chart has a suggested daily meal plan from the My Pyramid for Moms.  Eat a variety of fruits and vegetables.  Eat more dark green and orange vegetables and cooked dried beans.  Make half your grains whole grains. Choose whole instead of refined grains.  Choose low-fat or lean meats and poultry.  Choose low-fat or fat-free dairy products like milk, cheese, or yogurt. Fruits  Breastfeeding: 2 cups  Non-Breastfeeding: 2 cups  What Counts as a serving?  1 cup of fruit or juice.   cup dried fruit. Vegetables  Breastfeeding: 3  cups  Non-Breastfeeding: 2  cups  What Counts as a serving?  1 cup raw or cooked vegetables.  Juice or 2 cups raw leafy vegetables. Grains  Breastfeeding: 8 oz  Non-Breastfeeding: 6 oz  What Counts as a serving?  1 slice bread.  1 oz ready-to-eat cereal.   cup cooked pasta, rice, or cereal. Meat and Beans  Breastfeeding: 6  oz  Non-Breastfeeding: 5  oz  What Counts as a serving?  1 oz lean meat, poultry, or fish   cup cooked dry beans   oz nuts or 1 egg  1 tbs peanut butter Milk  Breastfeeding: 3 cups  Non-Breastfeeding: 3 cups  What Counts as a  serving?  1 cup milk.  8 oz yogurt.  1  oz cheese.  2 oz processed cheese. TIPS FOR THE BREASTFEEDING MOM  Rapid weight loss is not suggested when you are breastfeeding. By simply breastfeeding, you will be able to lose the weight gained during your pregnancy. Your caregiver can keep track of your weight and tell you if your weight loss is appropriate.  Be sure to drink fluids. You may notice that you are thirstier than usual. A suggestion is to drink a glass of water or other beverage whenever you breastfeed.  Avoid alcohol as it can be passed into your breast milk.  Limit caffeine drinks to no more than 2 to 3 cups per day.  You may need to keep taking your prenatal vitamin while you are breastfeeding. Talk with your caregiver about taking a vitamin or supplement. RETURING TO A HEALTHY WEIGHT  The My Pyramid Plan for Moms will help you return to a healthy weight. It will also provide the nutrients you need.  You may need to limit "empty" calories. These include:  High fat foods like fried foods, fatty meats, fast food, butter, and mayonnaise.  High sugar foods like sodas, jelly, candy, and sweets.  Be physically active. Include 30 minutes of exercise or more each day. Choose an activity you like such as walking, swimming, biking, or aerobics. Check with your caregiver before you start to  exercise. Document Released: 02/23/2008 Document Revised: 02/08/2012 Document Reviewed: 02/23/2008 [facility] Patient Information 2015 [place], [place]. This information is not intended to replace advice given to you by your health care provider. Make sure you discuss any questions you have with your health care provider. Postpartum Depression and Baby Blues The postpartum period begins right after the birth of a baby. During this time, there is often a great amount of joy and excitement. It is also a time of many changes in the life of the parents. Regardless of how many times a mother gives birth, each child brings new challenges and dynamics to the family. It is not unusual to have feelings of excitement along with confusing shifts in moods, emotions, and thoughts. All mothers are at risk of developing postpartum depression or the "baby blues." These mood changes can occur right after giving birth, or they may occur many months after giving birth. The baby blues or postpartum depression can be mild or severe. Additionally, postpartum depression can go away rather quickly, or it can be a long-term condition.  CAUSES Raised hormone levels and the rapid drop in those levels are thought to be a main cause of postpartum depression and the baby blues. A number of hormones change during and after pregnancy. Estrogen and progesterone usually decrease right after the delivery of your baby. The levels of thyroid hormone and various cortisol steroids also rapidly drop. Other factors that play a role in these mood changes include major life events and genetics.  RISK FACTORS If you have any of the following risks for the baby blues or postpartum depression, know what symptoms to watch out for during the postpartum period. Risk factors that may increase the likelihood of getting the baby blues or postpartum depression include:  Having a personal or family history of depression.   Having depression while being  pregnant.   Having premenstrual mood issues or mood issues related to oral contraceptives.  Having a lot of life stress.   Having marital conflict.   Lacking a social support network.   Having a baby with  special needs.   Having health problems, such as diabetes.  SIGNS AND SYMPTOMS Symptoms of baby blues include:  Brief changes in mood, such as going from extreme happiness to sadness.  Decreased concentration.   Difficulty sleeping.   Crying spells, tearfulness.   Irritability.   Anxiety.  Symptoms of postpartum depression typically begin within the first month after giving birth. These symptoms include:  Difficulty sleeping or excessive sleepiness.   Marked weight loss.   Agitation.   Feelings of worthlessness.   Lack of interest in activity or food.  Postpartum psychosis is a very serious condition and can be dangerous. Fortunately, it is rare. Displaying any of the following symptoms is cause for immediate medical attention. Symptoms of postpartum psychosis include:   Hallucinations and delusions.   Bizarre or disorganized behavior.   Confusion or disorientation.  DIAGNOSIS  A diagnosis is made by an evaluation of your symptoms. There are no medical or lab tests that lead to a diagnosis, but there are various questionnaires that a health care provider may use to identify those with the baby blues, postpartum depression, or psychosis. Often, a screening tool called the [place] Postnatal Depression Scale is used to diagnose depression in the postpartum period.  TREATMENT The baby blues usually goes away on its own in 1-2 weeks. Social support is often all that is needed. You will be encouraged to get adequate sleep and rest. Occasionally, you may be given medicines to help you sleep.  Postpartum depression requires treatment because it can last several months or longer if it is not treated. Treatment may include individual or group therapy,  medicine, or both to address any social, physiological, and psychological factors that may play a role in the depression. Regular exercise, a healthy diet, rest, and social support may also be strongly recommended.  Postpartum psychosis is more serious and needs treatment right away. Hospitalization is often needed. HOME CARE INSTRUCTIONS  Get as much rest as you can. Nap when the baby sleeps.   Exercise regularly. Some women find yoga and walking to be beneficial.   Eat a balanced and nourishing diet.   Do little things that you enjoy. Have a cup of tea, take a bubble bath, read your favorite magazine, or listen to your favorite music.  Avoid alcohol.   Ask for help with household chores, cooking, grocery shopping, or running errands as needed. Do not try to do everything.   Talk to people close to you about how you are feeling. Get support from your partner, family members, friends, or other new moms.  Try to stay positive in how you think. Think about the things you are grateful for.   Do not spend a lot of time alone.   Only take over-the-counter or prescription medicine as directed by your health care provider.  Keep all your postpartum appointments.   Let your health care provider know if you have any concerns.  SEEK MEDICAL CARE IF: You are having a reaction to or problems with your medicine. SEEK IMMEDIATE MEDICAL CARE IF:  You have suicidal feelings.   You think you may harm the baby or someone else. MAKE SURE YOU:  Understand these instructions.  Will watch your condition.  Will get help right away if you are not doing well or get worse. Document Released: 08/20/2004 Document Revised: 11/21/2013 Document Reviewed: 08/28/2013 [facility] Patient Information 2015 [place], [place]. This information is not intended to replace advice given to you by your health care provider. Make sure  you discuss any questions you have with your health care  provider. Breastfeeding and Mastitis Mastitis is inflammation of the breast tissue. It can occur in women who are breastfeeding. This can make breastfeeding painful. Mastitis will sometimes go away on its own. Your health care provider will help determine if treatment is needed. CAUSES Mastitis is often associated with a blocked milk (lactiferous) duct. This can happen when too much milk builds up in the breast. Causes of excess milk in the breast can include:  Poor latch-on. If your baby is not latched onto the breast properly, she or he may not empty your breast completely while breastfeeding.  Allowing too much time to pass between feedings.  Wearing a bra or other clothing that is too tight. This puts extra pressure on the lactiferous ducts so milk does not flow through them as it should. Mastitis can also be caused by a bacterial infection. Bacteria may enter the breast tissue through cuts or openings in the skin. In women who are breastfeeding, this may occur because of cracked or irritated skin. Cracks in the skin are often caused when your baby does not latch on properly to the breast. SIGNS AND SYMPTOMS  Swelling, redness, tenderness, and pain in an area of the breast.  Swelling of the glands under the arm on the same side.  Fever may or may not accompany mastitis. If an infection is allowed to progress, a collection of pus (abscess) may develop. DIAGNOSIS  Your health care provider can usually diagnose mastitis based on your symptoms and a physical exam. Tests may be done to help confirm the diagnosis. These may include:  Removal of pus from the breast by applying pressure to the area. This pus can be examined in the lab to determine which bacteria are present. If an abscess has developed, the fluid in the abscess can be removed with a needle. This can also be used to confirm the diagnosis and determine the bacteria present. In most cases, pus will not be present.  Blood tests to  determine if your body is fighting a bacterial infection.  Mammogram or ultrasound tests to rule out other problems or diseases. TREATMENT  Mastitis that occurs with breastfeeding will sometimes go away on its own. Your health care provider may choose to wait 24 hours after first seeing you to decide whether a prescription medicine is needed. If your symptoms are worse after 24 hours, your health care provider will likely prescribe an antibiotic medicine to treat the mastitis. He or she will determine which bacteria are most likely causing the infection and will then select an appropriate antibiotic medicine. This is sometimes changed based on the results of tests performed to identify the bacteria, or if there is no response to the antibiotic medicine selected. Antibiotic medicines are usually given by mouth. You may also be given medicine for pain. HOME CARE INSTRUCTIONS  Only take over-the-counter or prescription medicines for pain, fever, or discomfort as directed by your health care provider.  If your health care provider prescribed an antibiotic medicine, take the medicine as directed. Make sure you finish it even if you start to feel better.  Do not wear a tight or underwire bra. Wear a soft, supportive bra.  Increase your fluid intake, especially if you have a fever.  Continue to empty the breast. Your health care provider can tell you whether this milk is safe for your infant or needs to be thrown out. You may be told to stop nursing  until your health care provider thinks it is safe for your baby. Use a breast pump if you are advised to stop nursing.  Keep your nipples clean and dry.  Empty the first breast completely before going to the other breast. If your baby is not emptying your breasts completely for some reason, use a breast pump to empty your breasts.  If you go back to work, pump your breasts while at work to stay in time with your nursing schedule.  Avoid allowing your  breasts to become overly filled with milk (engorged). SEEK MEDICAL CARE IF:  You have pus-like discharge from the breast.  Your symptoms do not improve with the treatment prescribed by your health care provider within 2 days. SEEK IMMEDIATE MEDICAL CARE IF:  Your pain and swelling are getting worse.  You have pain that is not controlled with medicine.  You have a red line extending from the breast toward your armpit.  You have a fever or persistent symptoms for more than 2-3 days.  You have a fever and your symptoms suddenly get worse. MAKE SURE YOU:   Understand these instructions.  Will watch your condition.  Will get help right away if you are not doing well or get worse. Document Released: 03/13/2005 Document Revised: 11/21/2013 Document Reviewed: 06/22/2013 [facility] Patient Information 2015 [place], [place]. This information is not intended to replace advice given to you by your health care provider. Make sure you discuss any questions you have with your health care provider. Breastfeeding Deciding to breastfeed is one of the best choices you can make for you and your baby. A change in hormones during pregnancy causes your breast tissue to grow and increases the number and size of your milk ducts. These hormones also allow proteins, sugars, and fats from your blood supply to make breast milk in your milk-producing glands. Hormones prevent breast milk from being released before your baby is born as well as prompt milk flow after birth. Once breastfeeding has begun, thoughts of your baby, as well as his or her sucking or crying, can stimulate the release of milk from your milk-producing glands.  BENEFITS OF BREASTFEEDING For Your Baby  Your first milk (colostrum) helps your baby's digestive system function better.   There are antibodies in your milk that help your baby fight off infections.   Your baby has a lower incidence of asthma, allergies, and sudden infant death  syndrome.   The nutrients in breast milk are better for your baby than infant formulas and are designed uniquely for your baby's needs.   Breast milk improves your baby's brain development.   Your baby is less likely to develop other conditions, such as childhood obesity, asthma, or type 2 diabetes mellitus.  For You   Breastfeeding helps to create a very special bond between you and your baby.   Breastfeeding is convenient. Breast milk is always available at the correct temperature and costs nothing.   Breastfeeding helps to burn calories and helps you lose the weight gained during pregnancy.   Breastfeeding makes your uterus contract to its prepregnancy size faster and slows bleeding (lochia) after you give birth.   Breastfeeding helps to lower your risk of developing type 2 diabetes mellitus, osteoporosis, and breast or ovarian cancer later in life. SIGNS THAT YOUR BABY IS HUNGRY Early Signs of Hunger  Increased alertness or activity.  Stretching.  Movement of the head from side to side.  Movement of the head and opening of the mouth when  the corner of the mouth or cheek is stroked (rooting).  Increased sucking sounds, smacking lips, cooing, sighing, or squeaking.  Hand-to-mouth movements.  Increased sucking of fingers or hands. Late Signs of Hunger  Fussing.  Intermittent crying. Extreme Signs of Hunger Signs of extreme hunger will require calming and consoling before your baby will be able to breastfeed successfully. Do not wait for the following signs of extreme hunger to occur before you initiate breastfeeding:   Restlessness.  A loud, strong cry.   Screaming. BREASTFEEDING BASICS Breastfeeding Initiation  Find a comfortable place to sit or lie down, with your neck and back well supported.  Place a pillow or rolled up blanket under your baby to bring him or her to the level of your breast (if you are seated). Nursing pillows are specially designed  to help support your arms and your baby while you breastfeed.  Make sure that your baby's abdomen is facing your abdomen.   Gently massage your breast. With your fingertips, massage from your chest wall toward your nipple in a circular motion. This encourages milk flow. You may need to continue this action during the feeding if your milk flows slowly.  Support your breast with 4 fingers underneath and your thumb above your nipple. Make sure your fingers are well away from your nipple and your baby's mouth.   Stroke your baby's lips gently with your finger or nipple.   When your baby's mouth is open wide enough, quickly bring your baby to your breast, placing your entire nipple and as much of the colored area around your nipple (areola) as possible into your baby's mouth.   More areola should be visible above your baby's upper lip than below the lower lip.   Your baby's tongue should be between his or her lower gum and your breast.   Ensure that your baby's mouth is correctly positioned around your nipple (latched). Your baby's lips should create a seal on your breast and be turned out (everted).  It is common for your baby to suck about 2-3 minutes in order to start the flow of breast milk. Latching Teaching your baby how to latch on to your breast properly is very important. An improper latch can cause nipple pain and decreased milk supply for you and poor weight gain in your baby. Also, if your baby is not latched onto your nipple properly, he or she may swallow some air during feeding. This can make your baby fussy. Burping your baby when you switch breasts during the feeding can help to get rid of the air. However, teaching your baby to latch on properly is still the best way to prevent fussiness from swallowing air while breastfeeding. Signs that your baby has successfully latched on to your nipple:    Silent tugging or silent sucking, without causing you pain.   Swallowing  heard between every 3-4 sucks.    Muscle movement above and in front of his or her ears while sucking.  Signs that your baby has not successfully latched on to nipple:   Sucking sounds or smacking sounds from your baby while breastfeeding.  Nipple pain. If you think your baby has not latched on correctly, slip your finger into the corner of your baby's mouth to break the suction and place it between your baby's gums. Attempt breastfeeding initiation again. Signs of Successful Breastfeeding Signs from your baby:   A gradual decrease in the number of sucks or complete cessation of sucking.   Falling  asleep.   Relaxation of his or her body.   Retention of a small amount of milk in his or her mouth.   Letting go of your breast by himself or herself. Signs from you:  Breasts that have increased in firmness, weight, and size 1-3 hours after feeding.   Breasts that are softer immediately after breastfeeding.  Increased milk volume, as well as a change in milk consistency and color by the fifth day of breastfeeding.   Nipples that are not sore, cracked, or bleeding. Signs That Your Pecola Leisure is Getting Enough Milk  Wetting at least 3 diapers in a 24-hour period. The urine should be clear and pale yellow by age 32 days.  At least 3 stools in a 24-hour period by age 32 days. The stool should be soft and yellow.  At least 3 stools in a 24-hour period by age 569 days. The stool should be seedy and yellow.  No loss of weight greater than 10% of birth weight during the first 37 days of age.  Average weight gain of 4-7 ounces (113-198 g) per week after age 31 days.  Consistent daily weight gain by age 32 days, without weight loss after the age of 2 weeks. After a feeding, your baby may spit up a small amount. This is common. BREASTFEEDING FREQUENCY AND DURATION Frequent feeding will help you make more milk and can prevent sore nipples and breast engorgement. Breastfeed when you feel the  need to reduce the fullness of your breasts or when your baby shows signs of hunger. This is called "breastfeeding on demand." Avoid introducing a pacifier to your baby while you are working to establish breastfeeding (the first 4-6 weeks after your baby is born). After this time you may choose to use a pacifier. Research has shown that pacifier use during the first year of a baby's life decreases the risk of sudden infant death syndrome (SIDS). Allow your baby to feed on each breast as long as he or she wants. Breastfeed until your baby is finished feeding. When your baby unlatches or falls asleep while feeding from the first breast, offer the second breast. Because newborns are often sleepy in the first few weeks of life, you may need to awaken your baby to get him or her to feed. Breastfeeding times will vary from baby to baby. However, the following rules can serve as a guide to help you ensure that your baby is properly fed:  Newborns (babies 26 weeks of age or younger) may breastfeed every 1-3 hours.  Newborns should not go longer than 3 hours during the day or 5 hours during the night without breastfeeding.  You should breastfeed your baby a minimum of 8 times in a 24-hour period until you begin to introduce solid foods to your baby at around 93 months of age. BREAST MILK PUMPING Pumping and storing breast milk allows you to ensure that your baby is exclusively fed your breast milk, even at times when you are unable to breastfeed. This is especially important if you are going back to work while you are still breastfeeding or when you are not able to be present during feedings. Your lactation consultant can give you guidelines on how long it is safe to store breast milk.  A breast pump is a machine that allows you to pump milk from your breast into a sterile bottle. The pumped breast milk can then be stored in a refrigerator or freezer. Some breast pumps are operated by hand,  while others use  electricity. Ask your lactation consultant which type will work best for you. Breast pumps can be purchased, but some hospitals and breastfeeding support groups lease breast pumps on a monthly basis. A lactation consultant can teach you how to hand express breast milk, if you prefer not to use a pump.  CARING FOR YOUR BREASTS WHILE YOU BREASTFEED Nipples can become dry, cracked, and sore while breastfeeding. The following recommendations can help keep your breasts moisturized and healthy:  Avoid using soap on your nipples.   Wear a supportive bra. Although not required, special nursing bras and tank tops are designed to allow access to your breasts for breastfeeding without taking off your entire bra or top. Avoid wearing underwire-style bras or extremely tight bras.  Air dry your nipples for 3-88minutes after each feeding.   Use only cotton bra pads to absorb leaked breast milk. Leaking of breast milk between feedings is normal.   Use lanolin on your nipples after breastfeeding. Lanolin helps to maintain your skin's normal moisture barrier. If you use pure lanolin, you do not need to wash it off before feeding your baby again. Pure lanolin is not toxic to your baby. You may also hand express a few drops of breast milk and gently massage that milk into your nipples and allow the milk to air dry. In the first few weeks after giving birth, some women experience extremely full breasts (engorgement). Engorgement can make your breasts feel heavy, warm, and tender to the touch. Engorgement peaks within 3-5 days after you give birth. The following recommendations can help ease engorgement:  Completely empty your breasts while breastfeeding or pumping. You may want to start by applying warm, moist heat (in the shower or with warm water-soaked hand towels) just before feeding or pumping. This increases circulation and helps the milk flow. If your baby does not completely empty your breasts while  breastfeeding, pump any extra milk after he or she is finished.  Wear a snug bra (nursing or regular) or tank top for 1-2 days to signal your body to slightly decrease milk production.  Apply ice packs to your breasts, unless this is too uncomfortable for you.  Make sure that your baby is latched on and positioned properly while breastfeeding. If engorgement persists after 48 hours of following these recommendations, contact your health care provider or a Advertising copywriter. OVERALL HEALTH CARE RECOMMENDATIONS WHILE BREASTFEEDING  Eat healthy foods. Alternate between meals and snacks, eating 3 of each per day. Because what you eat affects your breast milk, some of the foods may make your baby more irritable than usual. Avoid eating these foods if you are sure that they are negatively affecting your baby.  Drink milk, fruit juice, and water to satisfy your thirst (about 10 glasses a day).   Rest often, relax, and continue to take your prenatal vitamins to prevent fatigue, stress, and anemia.  Continue breast self-awareness checks.  Avoid chewing and smoking tobacco.  Avoid alcohol and drug use. Some medicines that may be harmful to your baby can pass through breast milk. It is important to ask your health care provider before taking any medicine, including all over-the-counter and prescription medicine as well as vitamin and herbal supplements. It is possible to become pregnant while breastfeeding. If birth control is desired, ask your health care provider about options that will be safe for your baby. SEEK MEDICAL CARE IF:   You feel like you want to stop breastfeeding or have become frustrated  with breastfeeding.  You have painful breasts or nipples.  Your nipples are cracked or bleeding.  Your breasts are red, tender, or warm.  You have a swollen area on either breast.  You have a fever or chills.  You have nausea or vomiting.  You have drainage other than breast milk from  your nipples.  Your breasts do not become full before feedings by the fifth day after you give birth.  You feel sad and depressed.  Your baby is too sleepy to eat well.  Your baby is having trouble sleeping.   Your baby is wetting less than 3 diapers in a 24-hour period.  Your baby has less than 3 stools in a 24-hour period.  Your baby's skin or the white part of his or her eyes becomes yellow.   Your baby is not gaining weight by 55 days of age. SEEK IMMEDIATE MEDICAL CARE IF:   Your baby is overly tired (lethargic) and does not want to wake up and feed.  Your baby develops an unexplained fever. Document Released: 11/16/2005 Document Revised: 11/21/2013 Document Reviewed: 05/10/2013 [facility] Patient Information 2015 [place], [place]. This information is not intended to replace advice given to you by your health care provider. Make sure you discuss any questions you have with your health care provider.

## 2014-07-25 ENCOUNTER — Encounter (HOSPITAL_COMMUNITY): Admission: RE | Payer: Self-pay | Source: Ambulatory Visit

## 2014-07-25 ENCOUNTER — Inpatient Hospital Stay (HOSPITAL_COMMUNITY): Admission: RE | Admit: 2014-07-25 | Payer: BC Managed Care – PPO | Source: Ambulatory Visit | Admitting: Obstetrics

## 2014-07-25 SURGERY — Surgical Case
Anesthesia: Spinal | Laterality: Bilateral

## 2014-07-27 ENCOUNTER — Ambulatory Visit: Payer: Self-pay

## 2014-07-27 NOTE — Lactation Note (Incomplete)
This note was copied from the chart of [name]. Lactation Consultation Note  Mother came to NICU needing pump parts to begin pumping. States she did not know to take all pump parts with her when she left hospital. Mother states she has only been pumping 1 ml of breastmilk at home. Reviewed that she needs to pump every 3 hours.massaging and hand expressing before and after pumping. Set up DEBP. Discussed milk storage, cleaning, labeling and provided mother with NICU booklet to read. Encouraged mother to call if she has further questions.   Patient Name: [name] Date: 07/27/2014     Maternal Data    Feeding Feeding Type: Formula Nipple Type: Slow - flow Length of feed: 15 min  LATCH Score/Interventions                      Lactation Tools Discussed/Used     Consult Status      Hardie Pulley 07/27/2014, 10:38 AM

## 2014-10-01 ENCOUNTER — Encounter (HOSPITAL_COMMUNITY): Payer: Self-pay | Admitting: Obstetrics and Gynecology

## 2014-11-01 ENCOUNTER — Emergency Department (HOSPITAL_BASED_OUTPATIENT_CLINIC_OR_DEPARTMENT_OTHER)
Admission: EM | Admit: 2014-11-01 | Discharge: 2014-11-01 | Disposition: A | Discharge status: Home / Self Care | Payer: BC Managed Care – PPO | Attending: Emergency Medicine | Admitting: Emergency Medicine

## 2014-11-01 ENCOUNTER — Encounter (HOSPITAL_BASED_OUTPATIENT_CLINIC_OR_DEPARTMENT_OTHER): Payer: Self-pay | Admitting: Emergency Medicine

## 2014-11-01 DIAGNOSIS — Z79899 Other long term (current) drug therapy: Secondary | ICD-10-CM | POA: Insufficient documentation

## 2014-11-01 DIAGNOSIS — K088 Other specified disorders of teeth and supporting structures: Secondary | ICD-10-CM | POA: Insufficient documentation

## 2014-11-01 DIAGNOSIS — Z720 Tobacco use: Secondary | ICD-10-CM | POA: Diagnosis not present

## 2014-11-01 DIAGNOSIS — Z8719 Personal history of other diseases of the digestive system: Secondary | ICD-10-CM | POA: Diagnosis not present

## 2014-11-01 DIAGNOSIS — K0889 Other specified disorders of teeth and supporting structures: Secondary | ICD-10-CM

## 2014-11-01 DIAGNOSIS — Z792 Long term (current) use of antibiotics: Secondary | ICD-10-CM | POA: Diagnosis not present

## 2014-11-01 DIAGNOSIS — Z8739 Personal history of other diseases of the musculoskeletal system and connective tissue: Secondary | ICD-10-CM | POA: Diagnosis not present

## 2014-11-01 MED ORDER — PENICILLIN V POTASSIUM 250 MG PO TABS
500.0000 mg | ORAL_TABLET | Freq: Once | ORAL | Status: AC
  Administered 2014-11-01: 500 mg via ORAL
  Filled 2014-11-01: qty 2

## 2014-11-01 MED ORDER — IBUPROFEN 800 MG PO TABS: 800.0000 mg | ORAL_TABLET | Freq: Three times a day (TID) | ORAL | Status: AC

## 2014-11-01 MED ORDER — PENICILLIN V POTASSIUM 500 MG PO TABS: 500.0000 mg | ORAL_TABLET | Freq: Three times a day (TID) | ORAL | Status: AC

## 2014-11-01 MED ORDER — IBUPROFEN 800 MG PO TABS
800.0000 mg | ORAL_TABLET | Freq: Once | ORAL | Status: AC
  Administered 2014-11-01: 800 mg via ORAL
  Filled 2014-11-01: qty 1

## 2014-11-01 NOTE — Discharge Instructions (Signed)
Return to the ED with any concerns including difficulty breathing or swallowing, vomiting and not able to keep down liquids or antibiotics, decreased level of alertness/lethargy, or any other alarming symptoms °

## 2014-11-01 NOTE — ED Notes (Signed)
Pt reports that she awoke this am after chipping her tooth in her sleep, wants antibiotic for pain

## 2014-11-01 NOTE — ED Provider Notes (Signed)
CSN: [id number]     Arrival date & time 11/01/14  0547 History   First MD Initiated Contact with Patient 11/01/14 [phone]     Chief Complaint  Patient presents with  . Dental Pain     (Consider location/radiation/quality/duration/timing/severity/associated sxs/prior Treatment) HPI  Pt presents with c/o pain left upper tooth.  She states the tooth has been hurting for several days to weeks, but pain became worse tonight when she was grinding her teeth and a small piece of tooth broke off.  No fever/chills.  No difficuty breathing or swallowing.  Pt states pain is constant and severe.  She states she was on methadone- does not take any more but would prefer to not be taking narcotic medications.  There are no other associated systemic symptoms, there are no other alleviating or modifying factors.   Past Medical History  Diagnosis Date  . Vaginal Pap smear, abnormal   . Fibromyalgia   . GERD (gastroesophageal reflux disease)   . Back pain affecting pregnancy   . Postpartum care following cesarean delivery (8/21) 07/20/2014   Past Surgical History  Procedure Laterality Date  . Cesarean section    . Leep    . Cesarean section N/A 07/20/2014    Procedure: CESAREAN SECTION;  Surgeon: Lenoard Adenichard J Taavon, MD;  Location: [facility];  Service: Obstetrics;  Laterality: N/A;   History reviewed. No pertinent family history. History  Substance Use Topics  . Smoking status: Current Every Day Smoker -- 0.25 packs/day    Types: Cigarettes  . Smokeless tobacco: Never Used  . Alcohol Use: No   OB History    Gravida Para Term Preterm AB TAB SAB Ectopic Multiple Living   4 4 1       4      Review of Systems  ROS reviewed and all otherwise negative except for mentioned in HPI    Allergies  Review of patient's allergies indicates no known allergies.  Home Medications   Prior to Admission medications   Medication Sig Start Date End Date Taking? Authorizing Provider  ibuprofen (ADVIL,MOTRIN) 800 MG  tablet Take 1 tablet (800 mg total) by mouth every 8 (eight) hours. 07/24/14  Yes Raelyn Moraolitta Dawson, CNM  ibuprofen (ADVIL,MOTRIN) 800 MG tablet Take 1 tablet (800 mg total) by mouth 3 (three) times daily. 11/01/14   Ethelda ChickMartha K Linker, MD  magnesium oxide (MAG-OX) 400 (241.3 MG) MG tablet Take 0.5 tablets (200 mg total) by mouth daily. 07/22/14   Lawernce PittsMelanie N Bhambri, CNM  methadone (DOLOPHINE) 10 MG/5ML solution Take 50 mg by mouth daily.    Historical Provider, MD  oxyCODONE-acetaminophen (PERCOCET/ROXICET) 5-325 MG per tablet Take 1 tablet by mouth every 4 (four) hours as needed for severe pain. 07/24/14   Raelyn Moraolitta Dawson, CNM  penicillin v potassium (VEETID) 500 MG tablet Take 1 tablet (500 mg total) by mouth 3 (three) times daily. 11/01/14   Ethelda ChickMartha K Linker, MD  Prenatal Vit-Fe Fumarate-FA (PRENATAL MULTIVITAMIN) TABS tablet Take 1 tablet by mouth daily.    Historical Provider, MD   BP 129/69 mmHg  Pulse 82  Temp(Src) 98.1 F (36.7 C) (Oral)  Resp 18  Ht 4\' 10"  (1.473 m)  SpO2 100%  LMP 10/02/2014  Vitals reviewed Physical Exam  Physical Examination: General appearance - alert, well appearing, and in no distress Mental status - alert, oriented to person, place, and time Eyes - no conjunctival injection, no scleral icterus Mouth - mucous membranes moist, pharynx normal without lesions, left upper posterior molar with  decay and gingival irritation, no swelling under the tongue Neck - supple, no significant adenopathy Chest - normal respiratory efffort Extremities - peripheral pulses normal, no pedal edema, no clubbing or cyanosis Skin - normal coloration and turgor, no rashes  ED Course  Procedures (including critical care time) Labs Review Labs Reviewed - No data to display  Imaging Review No results found.   EKG Interpretation None      MDM   Final diagnoses:  Pain, dental    Pt with ongoing dental pain in left upper molar, made worse by grinding teeth tonight, may have some  element of periapical abcess present- pt started on peniciillin, recommended ibuprofen for discomfrot as she does not want to take narcotic meds.  Given information for dental followup.  She understands she needs to call today to make appointment.  Advised that dentist would be the one to provide definitive treatment for her tooth.  Discharged with strict return precautions.  Pt agreeable with plan.    Ethelda ChickMartha K Linker, MD 11/01/14 [phone]

## 2014-11-21 ENCOUNTER — Encounter (HOSPITAL_BASED_OUTPATIENT_CLINIC_OR_DEPARTMENT_OTHER): Payer: Self-pay | Admitting: Emergency Medicine

## 2014-11-21 ENCOUNTER — Emergency Department (HOSPITAL_BASED_OUTPATIENT_CLINIC_OR_DEPARTMENT_OTHER)
Admission: EM | Admit: 2014-11-21 | Discharge: 2014-11-21 | Disposition: A | Discharge status: Home / Self Care | Payer: BC Managed Care – PPO | Attending: Emergency Medicine | Admitting: Emergency Medicine

## 2014-11-21 DIAGNOSIS — M797 Fibromyalgia: Secondary | ICD-10-CM | POA: Insufficient documentation

## 2014-11-21 DIAGNOSIS — K088 Other specified disorders of teeth and supporting structures: Secondary | ICD-10-CM | POA: Diagnosis present

## 2014-11-21 DIAGNOSIS — K0889 Other specified disorders of teeth and supporting structures: Secondary | ICD-10-CM

## 2014-11-21 DIAGNOSIS — Z792 Long term (current) use of antibiotics: Secondary | ICD-10-CM | POA: Diagnosis not present

## 2014-11-21 DIAGNOSIS — Z8719 Personal history of other diseases of the digestive system: Secondary | ICD-10-CM | POA: Diagnosis not present

## 2014-11-21 DIAGNOSIS — Z79899 Other long term (current) drug therapy: Secondary | ICD-10-CM | POA: Diagnosis not present

## 2014-11-21 DIAGNOSIS — Z720 Tobacco use: Secondary | ICD-10-CM | POA: Insufficient documentation

## 2014-11-21 MED ORDER — HYDROCODONE-ACETAMINOPHEN 5-325 MG PO TABS: 1.0000 | ORAL_TABLET | Freq: Four times a day (QID) | ORAL | Status: AC | PRN

## 2014-11-21 MED ORDER — CLINDAMYCIN HCL 150 MG PO CAPS: 150.0000 mg | ORAL_CAPSULE | Freq: Three times a day (TID) | ORAL | Status: AC

## 2014-11-21 MED ORDER — CLINDAMYCIN HCL 150 MG PO CAPS
300.0000 mg | ORAL_CAPSULE | Freq: Once | ORAL | Status: AC
  Administered 2014-11-21: 300 mg via ORAL
  Filled 2014-11-21: qty 2

## 2014-11-21 NOTE — ED Notes (Signed)
Pt reports that she has had pain to top right back tooth, seen on 12/3 for same tooth, did not complete all of antibiotic, has appt with dentist on 12/30 to have it pulled

## 2014-11-21 NOTE — ED Provider Notes (Signed)
CSN: [id number]     Arrival date & time 11/21/14  0451 History   First MD Initiated Contact with Patient 11/21/14 0503     Chief Complaint  Patient presents with  . Dental Pain     (Consider location/radiation/quality/duration/timing/severity/associated sxs/prior Treatment) HPI  This 33 year old female who cracked her left upper first molar about 3 weeks ago. She was seen on December 3 and was prescribed ibuprofen and penicillin. She states she took the entire course of penicillin and had transient improvement in pain until yesterday when the pain returned. The pain is now moderate to severe, worse with eating. The pain radiates to the left side of her face. There is subjective swelling associated with it. She denies fever or chills. She has an appointment with [facility] on December 30 to have the tooth pulled.  Past Medical History  Diagnosis Date  . Vaginal Pap smear, abnormal   . Fibromyalgia   . GERD (gastroesophageal reflux disease)   . Back pain affecting pregnancy   . Postpartum care following cesarean delivery (8/21) 07/20/2014   Past Surgical History  Procedure Laterality Date  . Cesarean section    . Leep    . Cesarean section N/A 07/20/2014    Procedure: CESAREAN SECTION;  Surgeon: Lenoard Adenichard J Taavon, MD;  Location: [facility];  Service: Obstetrics;  Laterality: N/A;   History reviewed. No pertinent family history. History  Substance Use Topics  . Smoking status: Current Every Day Smoker -- 0.25 packs/day    Types: Cigarettes  . Smokeless tobacco: Never Used  . Alcohol Use: No   OB History    Gravida Para Term Preterm AB TAB SAB Ectopic Multiple Living   4 4 1       4      Review of Systems  All other systems reviewed and are negative.   Allergies  Review of patient's allergies indicates no known allergies.  Home Medications   Prior to Admission medications   Medication Sig Start Date End Date Taking? Authorizing Provider  ibuprofen  (ADVIL,MOTRIN) 800 MG tablet Take 1 tablet (800 mg total) by mouth every 8 (eight) hours. 07/24/14   Raelyn Moraolitta Dawson, CNM  ibuprofen (ADVIL,MOTRIN) 800 MG tablet Take 1 tablet (800 mg total) by mouth 3 (three) times daily. 11/01/14   Ethelda ChickMartha K Linker, MD  magnesium oxide (MAG-OX) 400 (241.3 MG) MG tablet Take 0.5 tablets (200 mg total) by mouth daily. 07/22/14   Lawernce PittsMelanie N Bhambri, CNM  methadone (DOLOPHINE) 10 MG/5ML solution Take 50 mg by mouth daily.    Historical Provider, MD  oxyCODONE-acetaminophen (PERCOCET/ROXICET) 5-325 MG per tablet Take 1 tablet by mouth every 4 (four) hours as needed for severe pain. 07/24/14   Raelyn Moraolitta Dawson, CNM  penicillin v potassium (VEETID) 500 MG tablet Take 1 tablet (500 mg total) by mouth 3 (three) times daily. 11/01/14   Ethelda ChickMartha K Linker, MD  Prenatal Vit-Fe Fumarate-FA (PRENATAL MULTIVITAMIN) TABS tablet Take 1 tablet by mouth daily.    Historical Provider, MD   BP 120/73 mmHg  Pulse 82  Temp(Src) 98.3 F (36.8 C) (Oral)  Resp 18  Ht 4\' 10"  (1.473 m)  Wt 110 lb (49.896 kg)  BMI 23.00 kg/m2  SpO2 99%  LMP 10/29/2014  Breastfeeding? 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; fractured left upper first molar with tenderness to percussion, no appreciable  swelling Eyes: pupils equal, round and reactive to light; extraocular muscles intact Neck: supple; left anterior cervical lymphadenopathy Heart: regular rate and rhythm Lungs: Normal respiratory effort and excursion Abdomen: soft; nondistended Extremities: No deformity; full range of motion Neurologic: Awake, alert and oriented; motor function intact in all extremities and symmetric; no facial droop Skin: Warm and dry Psychiatric: Flat affect    ED Course  Procedures (including critical care time)   MDM    Hanley SeamenJohn L Tomasina Keasling,  MD 11/21/14 [phone]

## 2014-11-21 NOTE — Discharge Instructions (Signed)
